# Patient Record
Sex: Male | Born: 1965 | Race: Black or African American | Hispanic: No | Marital: Married | Smoking: Never smoker
Health system: Southern US, Community
[De-identification: ages and names within clinical notes are randomized; demographics above are authoritative.]

## PROBLEM LIST (undated history)

## (undated) ENCOUNTER — Emergency Department (HOSPITAL_COMMUNITY): Admission: EM | Payer: Self-pay | Source: Home / Self Care

## (undated) DIAGNOSIS — F1010 Alcohol abuse, uncomplicated: Secondary | ICD-10-CM

## (undated) DIAGNOSIS — M1990 Unspecified osteoarthritis, unspecified site: Secondary | ICD-10-CM

## (undated) DIAGNOSIS — F329 Major depressive disorder, single episode, unspecified: Secondary | ICD-10-CM

## (undated) DIAGNOSIS — F32A Depression, unspecified: Secondary | ICD-10-CM

## (undated) DIAGNOSIS — F4310 Post-traumatic stress disorder, unspecified: Secondary | ICD-10-CM

## (undated) DIAGNOSIS — I10 Essential (primary) hypertension: Secondary | ICD-10-CM

## (undated) DIAGNOSIS — M109 Gout, unspecified: Secondary | ICD-10-CM

## (undated) HISTORY — PX: KNEE ARTHROSCOPY: SHX127

## (undated) HISTORY — PX: TONSILLECTOMY: SUR1361

## (undated) HISTORY — PX: ANKLE ARTHROSCOPY: SUR85

---
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
## 1992-11-04 HISTORY — PX: BUNIONECTOMY: SHX129

## 1998-06-29 ENCOUNTER — Emergency Department (HOSPITAL_COMMUNITY): Admission: EM | Admit: 1998-06-29 | Discharge: 1998-06-29 | Payer: Self-pay | Admitting: Emergency Medicine

## 1999-12-04 ENCOUNTER — Other Ambulatory Visit: Admission: RE | Admit: 1999-12-04 | Discharge: 1999-12-04 | Payer: Self-pay | Admitting: Orthopedic Surgery

## 2002-08-02 ENCOUNTER — Encounter: Payer: Self-pay | Admitting: Family Medicine

## 2002-08-02 ENCOUNTER — Ambulatory Visit (HOSPITAL_COMMUNITY): Admission: RE | Admit: 2002-08-02 | Discharge: 2002-08-02 | Payer: Self-pay | Admitting: Family Medicine

## 2002-08-18 ENCOUNTER — Encounter: Payer: Self-pay | Admitting: Family Medicine

## 2002-08-18 ENCOUNTER — Ambulatory Visit (HOSPITAL_COMMUNITY): Admission: RE | Admit: 2002-08-18 | Discharge: 2002-08-18 | Payer: Self-pay | Admitting: Family Medicine

## 2004-08-21 ENCOUNTER — Ambulatory Visit (HOSPITAL_BASED_OUTPATIENT_CLINIC_OR_DEPARTMENT_OTHER): Admission: RE | Admit: 2004-08-21 | Discharge: 2004-08-21 | Payer: Self-pay | Admitting: Orthopedic Surgery

## 2004-08-21 ENCOUNTER — Ambulatory Visit (HOSPITAL_COMMUNITY): Admission: RE | Admit: 2004-08-21 | Discharge: 2004-08-21 | Payer: Self-pay | Admitting: Orthopedic Surgery

## 2012-02-05 DIAGNOSIS — Z79899 Other long term (current) drug therapy: Secondary | ICD-10-CM | POA: Diagnosis not present

## 2012-02-05 DIAGNOSIS — M109 Gout, unspecified: Secondary | ICD-10-CM | POA: Diagnosis not present

## 2012-02-05 DIAGNOSIS — M5137 Other intervertebral disc degeneration, lumbosacral region: Secondary | ICD-10-CM | POA: Diagnosis not present

## 2012-02-05 DIAGNOSIS — Z125 Encounter for screening for malignant neoplasm of prostate: Secondary | ICD-10-CM | POA: Diagnosis not present

## 2012-02-05 DIAGNOSIS — I10 Essential (primary) hypertension: Secondary | ICD-10-CM | POA: Diagnosis not present

## 2012-02-26 DIAGNOSIS — M62838 Other muscle spasm: Secondary | ICD-10-CM | POA: Diagnosis not present

## 2012-02-26 DIAGNOSIS — M5137 Other intervertebral disc degeneration, lumbosacral region: Secondary | ICD-10-CM | POA: Diagnosis not present

## 2012-02-26 DIAGNOSIS — M545 Low back pain: Secondary | ICD-10-CM | POA: Diagnosis not present

## 2012-03-02 DIAGNOSIS — M545 Low back pain: Secondary | ICD-10-CM | POA: Diagnosis not present

## 2012-03-02 DIAGNOSIS — M5137 Other intervertebral disc degeneration, lumbosacral region: Secondary | ICD-10-CM | POA: Diagnosis not present

## 2012-03-02 DIAGNOSIS — M62838 Other muscle spasm: Secondary | ICD-10-CM | POA: Diagnosis not present

## 2012-03-05 DIAGNOSIS — M62838 Other muscle spasm: Secondary | ICD-10-CM | POA: Diagnosis not present

## 2012-03-05 DIAGNOSIS — M545 Low back pain: Secondary | ICD-10-CM | POA: Diagnosis not present

## 2012-03-05 DIAGNOSIS — M5137 Other intervertebral disc degeneration, lumbosacral region: Secondary | ICD-10-CM | POA: Diagnosis not present

## 2012-03-17 DIAGNOSIS — M5430 Sciatica, unspecified side: Secondary | ICD-10-CM | POA: Diagnosis not present

## 2012-03-24 DIAGNOSIS — M62838 Other muscle spasm: Secondary | ICD-10-CM | POA: Diagnosis not present

## 2012-03-31 DIAGNOSIS — M5126 Other intervertebral disc displacement, lumbar region: Secondary | ICD-10-CM | POA: Diagnosis not present

## 2012-03-31 DIAGNOSIS — M5137 Other intervertebral disc degeneration, lumbosacral region: Secondary | ICD-10-CM | POA: Diagnosis not present

## 2012-03-31 DIAGNOSIS — IMO0002 Reserved for concepts with insufficient information to code with codable children: Secondary | ICD-10-CM | POA: Diagnosis not present

## 2012-04-16 DIAGNOSIS — M5126 Other intervertebral disc displacement, lumbar region: Secondary | ICD-10-CM | POA: Diagnosis not present

## 2012-04-16 DIAGNOSIS — IMO0002 Reserved for concepts with insufficient information to code with codable children: Secondary | ICD-10-CM | POA: Diagnosis not present

## 2012-04-16 DIAGNOSIS — M5137 Other intervertebral disc degeneration, lumbosacral region: Secondary | ICD-10-CM | POA: Diagnosis not present

## 2012-04-30 DIAGNOSIS — M5126 Other intervertebral disc displacement, lumbar region: Secondary | ICD-10-CM | POA: Diagnosis not present

## 2012-04-30 DIAGNOSIS — IMO0002 Reserved for concepts with insufficient information to code with codable children: Secondary | ICD-10-CM | POA: Diagnosis not present

## 2012-06-01 DIAGNOSIS — M5126 Other intervertebral disc displacement, lumbar region: Secondary | ICD-10-CM | POA: Diagnosis not present

## 2012-06-01 DIAGNOSIS — IMO0002 Reserved for concepts with insufficient information to code with codable children: Secondary | ICD-10-CM | POA: Diagnosis not present

## 2012-06-01 DIAGNOSIS — M5137 Other intervertebral disc degeneration, lumbosacral region: Secondary | ICD-10-CM | POA: Diagnosis not present

## 2012-06-10 DIAGNOSIS — IMO0002 Reserved for concepts with insufficient information to code with codable children: Secondary | ICD-10-CM | POA: Diagnosis not present

## 2012-06-10 DIAGNOSIS — M5126 Other intervertebral disc displacement, lumbar region: Secondary | ICD-10-CM | POA: Diagnosis not present

## 2012-09-16 DIAGNOSIS — IMO0002 Reserved for concepts with insufficient information to code with codable children: Secondary | ICD-10-CM | POA: Diagnosis not present

## 2012-09-16 DIAGNOSIS — M5126 Other intervertebral disc displacement, lumbar region: Secondary | ICD-10-CM | POA: Diagnosis not present

## 2012-09-18 ENCOUNTER — Other Ambulatory Visit: Payer: Self-pay | Admitting: Neurological Surgery

## 2012-09-23 ENCOUNTER — Ambulatory Visit
Admission: RE | Admit: 2012-09-23 | Discharge: 2012-09-23 | Disposition: A | Discharge status: Home / Self Care | Payer: Medicare Other | Source: Ambulatory Visit | Attending: Sports Medicine | Admitting: Sports Medicine

## 2012-09-23 ENCOUNTER — Ambulatory Visit (INDEPENDENT_AMBULATORY_CARE_PROVIDER_SITE_OTHER): Payer: Medicare Other | Admitting: Sports Medicine

## 2012-09-23 VITALS — BP 142/90 | Ht 71.0 in | Wt 176.0 lb

## 2012-09-23 DIAGNOSIS — M25561 Pain in right knee: Secondary | ICD-10-CM

## 2012-09-23 DIAGNOSIS — M25562 Pain in left knee: Secondary | ICD-10-CM

## 2012-09-23 DIAGNOSIS — M1710 Unilateral primary osteoarthritis, unspecified knee: Secondary | ICD-10-CM | POA: Diagnosis not present

## 2012-09-23 DIAGNOSIS — M25569 Pain in unspecified knee: Secondary | ICD-10-CM

## 2012-09-23 MED ORDER — MELOXICAM 15 MG PO TABS: 15.0000 mg | ORAL_TABLET | Freq: Every day | ORAL | Status: DC

## 2012-09-23 NOTE — Progress Notes (Signed)
  Subjective:    Patient ID: Jared [name], male    DOB: 1966/08/09, 46 y.o.   MRN: [id number]  HPI chief complaint: Bilateral knee pain and low back pain  Patient is a 46 year old male whom I appears for for many years at [facility]. He has been treated over the years for bilateral ankle and knee pain. Multiple surgeries to each of these joints. 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 He's been treated with anti-inflammatories and cortisone injections. He is a disabled Building services engineer and is requesting that I write a letter on his behalf. He suffered a fall earlier this year when his knee gave way. This fall resulted in a lumbar disc herniation which will now need operative intervention. He is scheduled for surgery next Tuesday. He has had treatment both here and [facility] and as well as through the [facility]. He takes Mobic daily. Oxycodone when necessary. Today his knee pain is tolerable. No swelling.  No known drug allergies Socially he states he drinks a sixpack of beer a week He is retired from [employer]    Review of Systems     Objective:   Physical Exam Well-developed, well-nourished. No acute distress. Awake alert and oriented x3  Right knee: Active and passive range of motion on today's exam shows a range from 0-100. No effusion. There is tenderness to palpation along the medial joint line but a negative McMurray. Slight tenderness along the lateral joint line. Knee is stable to ligamentous exam. No erythema. Neurovascular intact distally.  Left knee: Active and passive range of motion on today's exam shows a range from 0-100. No effusion. There is tenderness along the medial joint line to palpation but no tenderness laterally. Negative McMurray. Good ligamentous stability. Neurovascularly intact distally.  Patient walks with a slight limp  X-rays of each of his knees done today shows a mild amount of  medial compartmental narrowing and some sclerosis of the medial compartment.   Lumbar spine was not evaluated. Patient is scheduled for surgery with Dr. Danielle Dess next week.       Assessment & Plan:  1. 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 Lumbar disc herniation  Review of the records from [facility] shows this patient to have had multiple procedures to each ankle and each knee. Patient did have some early degenerative changes on his right knee arthroscopy in January 2001, specifically some grade 2 and grade 3 changes in the medial compartment. Although his x-rays show only minimal degenerative changes, I do believe that the instability that he experienced which led to his fall earlier this year was a result of his chronic knee issues. I will refill his meloxicam but will defer his narcotic refill to his primary care physician. I will write a letter on his behalf to the [place]. Followup prn.

## 2012-09-24 ENCOUNTER — Encounter (HOSPITAL_COMMUNITY)
Admission: RE | Admit: 2012-09-24 | Discharge: 2012-09-24 | Disposition: A | Discharge status: Home / Self Care | Payer: Medicare Other | Source: Ambulatory Visit | Attending: Neurological Surgery | Admitting: Neurological Surgery

## 2012-09-24 ENCOUNTER — Encounter (HOSPITAL_COMMUNITY): Payer: Self-pay

## 2012-09-24 ENCOUNTER — Encounter (HOSPITAL_COMMUNITY): Payer: Self-pay | Admitting: Respiratory Therapy

## 2012-09-24 ENCOUNTER — Ambulatory Visit (HOSPITAL_COMMUNITY)
Admission: RE | Admit: 2012-09-24 | Discharge: 2012-09-24 | Disposition: A | Discharge status: Home / Self Care | Payer: Medicare Other | Source: Ambulatory Visit | Attending: Anesthesiology | Admitting: Anesthesiology

## 2012-09-24 DIAGNOSIS — Z01818 Encounter for other preprocedural examination: Secondary | ICD-10-CM | POA: Insufficient documentation

## 2012-09-24 HISTORY — DX: Post-traumatic stress disorder, unspecified: F43.10

## 2012-09-24 HISTORY — DX: Essential (primary) hypertension: I10

## 2012-09-24 HISTORY — DX: Gout, unspecified: M10.9

## 2012-09-24 HISTORY — DX: Depression, unspecified: F32.A

## 2012-09-24 HISTORY — DX: Unspecified osteoarthritis, unspecified site: M19.90

## 2012-09-24 HISTORY — DX: Major depressive disorder, single episode, unspecified: F32.9

## 2012-09-24 LAB — CBC
MCH: 31.5 pg (ref 26.0–34.0)
Platelets: 246 10*3/uL (ref 150–400)
RBC: 5.05 MIL/uL (ref 4.22–5.81)
WBC: 5.1 10*3/uL (ref 4.0–10.5)

## 2012-09-24 LAB — BASIC METABOLIC PANEL
Calcium: 9.7 mg/dL (ref 8.4–10.5)
GFR calc non Af Amer: >90 mL/min (ref >90)
Sodium: 136 mEq/L (ref 135–145)

## 2012-09-24 LAB — SURGICAL PCR SCREEN: Staphylococcus aureus: POSITIVE — AB

## 2012-09-24 NOTE — Pre-Procedure Instructions (Signed)
20 Jared [name]  09/24/2012   Your procedure is scheduled on:  Tuesday, November 26  Report to [facility] at 0630 AM.  Call this number if you have problems the morning of surgery: [phone]   Remember:   Do not eat food or drink liquids:After Midnight.      Take these medicines the morning of surgery with A SIP OF WATER: Gabapentin,Allopurinol   Do not wear jewelry, make-up or nail polish.  Do not wear lotions, powders, or perfumes. You may wear deodorant.  Do not shave 48 hours prior to surgery. Men may shave face and neck.  Do not bring valuables to the hospital.  Contacts, dentures or bridgework may not be worn into surgery.  Leave suitcase in the car. After surgery it may be brought to your room.  For patients admitted to the hospital, checkout time is 11:00 AM the day of discharge.   Patients discharged the day of surgery will not be allowed to drive home.     Special Instructions: Shower using CHG 2 nights before surgery and the night before surgery.  If you shower the day of surgery use CHG.  Use special wash - you have one bottle of CHG for all showers.  You should use approximately 1/3 of the bottle for each shower.   Please read over the following fact sheets that you were given: Pain Booklet, Coughing and Deep Breathing, MRSA Information and Surgical Site Infection Prevention

## 2012-09-28 MED ORDER — CEFAZOLIN SODIUM-DEXTROSE 2-3 GM-% IV SOLR
2.0000 g | INTRAVENOUS | Status: AC
  Administered 2012-09-29: 2 g via INTRAVENOUS
  Filled 2012-09-28: qty 50

## 2012-09-29 ENCOUNTER — Encounter (HOSPITAL_COMMUNITY): Payer: Self-pay | Admitting: Certified Registered Nurse Anesthetist

## 2012-09-29 ENCOUNTER — Ambulatory Visit (HOSPITAL_COMMUNITY): Payer: Medicare Other | Admitting: Certified Registered Nurse Anesthetist

## 2012-09-29 ENCOUNTER — Ambulatory Visit (HOSPITAL_COMMUNITY)
Admission: RE | Admit: 2012-09-29 | Discharge: 2012-09-29 | Disposition: A | Discharge status: Home / Self Care | Payer: Medicare Other | Source: Ambulatory Visit | Attending: Neurological Surgery | Admitting: Neurological Surgery

## 2012-09-29 ENCOUNTER — Encounter (HOSPITAL_COMMUNITY)
Admission: RE | Disposition: A | Discharge status: Home / Self Care | Payer: Self-pay | Source: Ambulatory Visit | Attending: Neurological Surgery

## 2012-09-29 ENCOUNTER — Ambulatory Visit (HOSPITAL_COMMUNITY): Payer: Medicare Other

## 2012-09-29 DIAGNOSIS — M5126 Other intervertebral disc displacement, lumbar region: Secondary | ICD-10-CM | POA: Diagnosis not present

## 2012-09-29 DIAGNOSIS — M539 Dorsopathy, unspecified: Secondary | ICD-10-CM | POA: Diagnosis not present

## 2012-09-29 DIAGNOSIS — I10 Essential (primary) hypertension: Secondary | ICD-10-CM | POA: Insufficient documentation

## 2012-09-29 DIAGNOSIS — IMO0002 Reserved for concepts with insufficient information to code with codable children: Secondary | ICD-10-CM | POA: Diagnosis not present

## 2012-09-29 HISTORY — PX: LUMBAR LAMINECTOMY/DECOMPRESSION MICRODISCECTOMY: SHX5026

## 2012-09-29 SURGERY — LUMBAR LAMINECTOMY/DECOMPRESSION MICRODISCECTOMY 2 LEVELS
Anesthesia: General | Site: Back | Laterality: Left | Wound class: Clean

## 2012-09-29 MED ORDER — LIDOCAINE HCL (CARDIAC) 20 MG/ML IV SOLN
INTRAVENOUS | Status: DC | PRN
  Administered 2012-09-29: 100 mg via INTRAVENOUS

## 2012-09-29 MED ORDER — CEFAZOLIN SODIUM 1-5 GM-% IV SOLN
1.0000 g | Freq: Three times a day (TID) | INTRAVENOUS | Status: DC
  Administered 2012-09-29: 1 g via INTRAVENOUS
  Filled 2012-09-29 (×2): qty 50

## 2012-09-29 MED ORDER — ALLOPURINOL 300 MG PO TABS
300.0000 mg | ORAL_TABLET | Freq: Every day | ORAL | Status: DC
  Administered 2012-09-29: 300 mg via ORAL
  Filled 2012-09-29: qty 1

## 2012-09-29 MED ORDER — PHENYLEPHRINE HCL 10 MG/ML IJ SOLN
INTRAMUSCULAR | Status: DC | PRN
  Administered 2012-09-29 (×2): 80 ug via INTRAVENOUS

## 2012-09-29 MED ORDER — OXYCODONE-ACETAMINOPHEN 5-325 MG PO TABS
1.0000 | ORAL_TABLET | ORAL | Status: DC | PRN
  Administered 2012-09-29: 2 via ORAL
  Filled 2012-09-29: qty 2

## 2012-09-29 MED ORDER — LACTATED RINGERS IV SOLN
INTRAVENOUS | Status: DC | PRN
  Administered 2012-09-29 (×2): via INTRAVENOUS

## 2012-09-29 MED ORDER — FENTANYL CITRATE 0.05 MG/ML IJ SOLN
INTRAMUSCULAR | Status: DC | PRN
  Administered 2012-09-29: 100 ug via INTRAVENOUS

## 2012-09-29 MED ORDER — SODIUM CHLORIDE 0.9 % IJ SOLN: 3.0000 mL | INTRAMUSCULAR | Status: DC | PRN

## 2012-09-29 MED ORDER — OXYCODONE HCL 5 MG/5ML PO SOLN: 5.0000 mg | Freq: Once | ORAL | Status: DC | PRN

## 2012-09-29 MED ORDER — LISINOPRIL 20 MG PO TABS
20.0000 mg | ORAL_TABLET | Freq: Every day | ORAL | Status: DC
  Administered 2012-09-29: 20 mg via ORAL
  Filled 2012-09-29: qty 1

## 2012-09-29 MED ORDER — MIRTAZAPINE 15 MG PO TABS
15.0000 mg | ORAL_TABLET | Freq: Every day | ORAL | Status: DC
  Filled 2012-09-29: qty 1

## 2012-09-29 MED ORDER — ROCURONIUM BROMIDE 100 MG/10ML IV SOLN
INTRAVENOUS | Status: DC | PRN
  Administered 2012-09-29: 10 mg via INTRAVENOUS
  Administered 2012-09-29: 50 mg via INTRAVENOUS

## 2012-09-29 MED ORDER — LABETALOL HCL 5 MG/ML IV SOLN
INTRAVENOUS | Status: AC
  Filled 2012-09-29: qty 4

## 2012-09-29 MED ORDER — OXYCODONE HCL 5 MG PO TABS: 5.0000 mg | ORAL_TABLET | Freq: Once | ORAL | Status: DC | PRN

## 2012-09-29 MED ORDER — ACETAMINOPHEN 650 MG RE SUPP: 650.0000 mg | RECTAL | Status: DC | PRN

## 2012-09-29 MED ORDER — HYDROMORPHONE HCL PF 1 MG/ML IJ SOLN
INTRAMUSCULAR | Status: AC
  Filled 2012-09-29: qty 1

## 2012-09-29 MED ORDER — KETOROLAC TROMETHAMINE 30 MG/ML IJ SOLN
30.0000 mg | Freq: Four times a day (QID) | INTRAMUSCULAR | Status: DC
  Administered 2012-09-29: 30 mg via INTRAVENOUS

## 2012-09-29 MED ORDER — MENTHOL 3 MG MT LOZG: 1.0000 | LOZENGE | OROMUCOSAL | Status: DC | PRN

## 2012-09-29 MED ORDER — PHENOL 1.4 % MT LIQD: 1.0000 | OROMUCOSAL | Status: DC | PRN

## 2012-09-29 MED ORDER — OXYCODONE-ACETAMINOPHEN 5-325 MG PO TABS: 1.0000 | ORAL_TABLET | ORAL | Status: DC | PRN

## 2012-09-29 MED ORDER — ALUM & MAG HYDROXIDE-SIMETH 200-200-20 MG/5ML PO SUSP: 30.0000 mL | Freq: Four times a day (QID) | ORAL | Status: DC | PRN

## 2012-09-29 MED ORDER — SODIUM CHLORIDE 0.9 % IJ SOLN
3.0000 mL | Freq: Two times a day (BID) | INTRAMUSCULAR | Status: DC
  Administered 2012-09-29: 3 mL via INTRAVENOUS

## 2012-09-29 MED ORDER — STERILE WATER FOR IRRIGATION IR SOLN
Status: DC | PRN
  Administered 2012-09-29: 1000 mL

## 2012-09-29 MED ORDER — LIDOCAINE-EPINEPHRINE 1 %-1:100000 IJ SOLN
INTRAMUSCULAR | Status: DC | PRN
  Administered 2012-09-29: 4.5 mL via INTRADERMAL

## 2012-09-29 MED ORDER — HYDROMORPHONE HCL PF 1 MG/ML IJ SOLN
0.2500 mg | INTRAMUSCULAR | Status: DC | PRN
  Administered 2012-09-29 (×4): 0.5 mg via INTRAVENOUS

## 2012-09-29 MED ORDER — DIAZEPAM 5 MG PO TABS
5.0000 mg | ORAL_TABLET | Freq: Four times a day (QID) | ORAL | Status: DC | PRN
  Administered 2012-09-29: 5 mg via ORAL
  Filled 2012-09-29: qty 1

## 2012-09-29 MED ORDER — HEMOSTATIC AGENTS (NO CHARGE) OPTIME
TOPICAL | Status: DC | PRN
  Administered 2012-09-29: 1 via TOPICAL

## 2012-09-29 MED ORDER — SODIUM CHLORIDE 0.9 % IV SOLN: 250.0000 mL | INTRAVENOUS | Status: DC

## 2012-09-29 MED ORDER — VITAMIN D3 25 MCG (1000 UNIT) PO TABS
1000.0000 [IU] | ORAL_TABLET | Freq: Every day | ORAL | Status: DC
  Administered 2012-09-29: 1000 [IU] via ORAL
  Filled 2012-09-29: qty 1

## 2012-09-29 MED ORDER — MORPHINE SULFATE 2 MG/ML IJ SOLN
1.0000 mg | INTRAMUSCULAR | Status: DC | PRN
  Administered 2012-09-29: 2 mg via INTRAVENOUS
  Filled 2012-09-29: qty 1

## 2012-09-29 MED ORDER — METOCLOPRAMIDE HCL 5 MG/ML IJ SOLN: 10.0000 mg | Freq: Once | INTRAMUSCULAR | Status: DC | PRN

## 2012-09-29 MED ORDER — AMOXICILLIN 500 MG PO CAPS
500.0000 mg | ORAL_CAPSULE | Freq: Three times a day (TID) | ORAL | Status: DC
  Administered 2012-09-29: 500 mg via ORAL
  Filled 2012-09-29 (×2): qty 1

## 2012-09-29 MED ORDER — ONDANSETRON HCL 4 MG/2ML IJ SOLN: 4.0000 mg | INTRAMUSCULAR | Status: DC | PRN

## 2012-09-29 MED ORDER — 0.9 % SODIUM CHLORIDE (POUR BTL) OPTIME
TOPICAL | Status: DC | PRN
  Administered 2012-09-29: 1000 mL

## 2012-09-29 MED ORDER — DIAZEPAM 5 MG PO TABS: 5.0000 mg | ORAL_TABLET | Freq: Four times a day (QID) | ORAL | Status: DC | PRN

## 2012-09-29 MED ORDER — NEOSTIGMINE METHYLSULFATE 1 MG/ML IJ SOLN
INTRAMUSCULAR | Status: DC | PRN
  Administered 2012-09-29: 5 mg via INTRAVENOUS

## 2012-09-29 MED ORDER — THROMBIN 5000 UNITS EX KIT
PACK | CUTANEOUS | Status: DC | PRN
  Administered 2012-09-29 (×2): 5000 [IU] via TOPICAL

## 2012-09-29 MED ORDER — ACETAMINOPHEN 10 MG/ML IV SOLN
INTRAVENOUS | Status: AC
  Administered 2012-09-29: 1000 mg via INTRAVENOUS
  Filled 2012-09-29: qty 100

## 2012-09-29 MED ORDER — ONDANSETRON HCL 4 MG/2ML IJ SOLN
INTRAMUSCULAR | Status: DC | PRN
  Administered 2012-09-29: 4 mg via INTRAVENOUS

## 2012-09-29 MED ORDER — ACETAMINOPHEN 325 MG PO TABS: 650.0000 mg | ORAL_TABLET | ORAL | Status: DC | PRN

## 2012-09-29 MED ORDER — DEXAMETHASONE SODIUM PHOSPHATE 10 MG/ML IJ SOLN
INTRAMUSCULAR | Status: AC
  Administered 2012-09-29: 10 mg via INTRAVENOUS
  Filled 2012-09-29: qty 1

## 2012-09-29 MED ORDER — PROPOFOL 10 MG/ML IV BOLUS
INTRAVENOUS | Status: DC | PRN
  Administered 2012-09-29: 200 mg via INTRAVENOUS

## 2012-09-29 MED ORDER — MIDAZOLAM HCL 5 MG/5ML IJ SOLN
INTRAMUSCULAR | Status: DC | PRN
  Administered 2012-09-29: 2 mg via INTRAVENOUS

## 2012-09-29 MED ORDER — SODIUM CHLORIDE 0.9 % IV SOLN
INTRAVENOUS | Status: AC
  Filled 2012-09-29: qty 500

## 2012-09-29 MED ORDER — GABAPENTIN 300 MG PO CAPS
300.0000 mg | ORAL_CAPSULE | Freq: Three times a day (TID) | ORAL | Status: DC
  Administered 2012-09-29: 300 mg via ORAL
  Filled 2012-09-29 (×2): qty 1

## 2012-09-29 MED ORDER — THROMBIN 5000 UNITS EX SOLR: CUTANEOUS | Status: DC | PRN

## 2012-09-29 MED ORDER — SODIUM CHLORIDE 0.9 % IR SOLN
Status: DC | PRN
  Administered 2012-09-29: 10:00:00

## 2012-09-29 MED ORDER — LABETALOL HCL 5 MG/ML IV SOLN
5.0000 mg | INTRAVENOUS | Status: AC | PRN
  Administered 2012-09-29 (×4): 5 mg via INTRAVENOUS

## 2012-09-29 MED ORDER — GLYCOPYRROLATE 0.2 MG/ML IJ SOLN
INTRAMUSCULAR | Status: DC | PRN
  Administered 2012-09-29: .8 mg via INTRAVENOUS

## 2012-09-29 MED ORDER — BACITRACIN 50000 UNITS IM SOLR
INTRAMUSCULAR | Status: AC
  Filled 2012-09-29: qty 1

## 2012-09-29 MED ORDER — BUPIVACAINE HCL (PF) 0.5 % IJ SOLN
INTRAMUSCULAR | Status: DC | PRN
  Administered 2012-09-29: 4.5 mL

## 2012-09-29 SURGICAL SUPPLY — 59 items
ADH SKN CLS APL DERMABOND .7 (GAUZE/BANDAGES/DRESSINGS)
ADH SKN CLS LQ APL DERMABOND (GAUZE/BANDAGES/DRESSINGS) ×1
BAG DECANTER FOR FLEXI CONT (MISCELLANEOUS) ×2 IMPLANT
BLADE SURG ROTATE 9660 (MISCELLANEOUS) IMPLANT
BUR ACORN 6.0 (BURR) ×1 IMPLANT
BUR MATCHSTICK NEURO 3.0 LAGG (BURR) ×2 IMPLANT
CANISTER SUCTION 2500CC (MISCELLANEOUS) ×2 IMPLANT
CLOTH BEACON ORANGE TIMEOUT ST (SAFETY) ×2 IMPLANT
CONT SPEC 4OZ CLIKSEAL STRL BL (MISCELLANEOUS) ×2 IMPLANT
DECANTER SPIKE VIAL GLASS SM (MISCELLANEOUS) ×2 IMPLANT
DERMABOND ADHESIVE PROPEN (GAUZE/BANDAGES/DRESSINGS) ×1
DERMABOND ADVANCED (GAUZE/BANDAGES/DRESSINGS)
DERMABOND ADVANCED .7 DNX12 (GAUZE/BANDAGES/DRESSINGS) ×1 IMPLANT
DERMABOND ADVANCED .7 DNX6 (GAUZE/BANDAGES/DRESSINGS) IMPLANT
DRAPE LAPAROTOMY 100X72X124 (DRAPES) ×2 IMPLANT
DRAPE MICROSCOPE LEICA (MISCELLANEOUS) ×1 IMPLANT
DRAPE POUCH INSTRU U-SHP 10X18 (DRAPES) ×2 IMPLANT
DRAPE PROXIMA HALF (DRAPES) IMPLANT
DURAPREP 26ML APPLICATOR (WOUND CARE) ×2 IMPLANT
ELECT REM PT RETURN 9FT ADLT (ELECTROSURGICAL) ×2
ELECTRODE REM PT RTRN 9FT ADLT (ELECTROSURGICAL) ×1 IMPLANT
GAUZE SPONGE 4X4 16PLY XRAY LF (GAUZE/BANDAGES/DRESSINGS) IMPLANT
GLOVE BIO SURGEON STRL SZ 6.5 (GLOVE) ×1 IMPLANT
GLOVE BIO SURGEON STRL SZ8 (GLOVE) ×1 IMPLANT
GLOVE BIOGEL PI IND STRL 6.5 (GLOVE) IMPLANT
GLOVE BIOGEL PI IND STRL 8 (GLOVE) IMPLANT
GLOVE BIOGEL PI IND STRL 8.5 (GLOVE) ×1 IMPLANT
GLOVE BIOGEL PI INDICATOR 6.5 (GLOVE) ×1
GLOVE BIOGEL PI INDICATOR 8 (GLOVE) ×1
GLOVE BIOGEL PI INDICATOR 8.5 (GLOVE) ×2
GLOVE ECLIPSE 7.5 STRL STRAW (GLOVE) ×1 IMPLANT
GLOVE ECLIPSE 8.5 STRL (GLOVE) ×2 IMPLANT
GLOVE EXAM NITRILE LRG STRL (GLOVE) IMPLANT
GLOVE EXAM NITRILE MD LF STRL (GLOVE) IMPLANT
GLOVE EXAM NITRILE XL STR (GLOVE) IMPLANT
GLOVE EXAM NITRILE XS STR PU (GLOVE) IMPLANT
GOWN BRE IMP SLV AUR LG STRL (GOWN DISPOSABLE) ×2 IMPLANT
GOWN BRE IMP SLV AUR XL STRL (GOWN DISPOSABLE) ×2 IMPLANT
GOWN STRL REIN 2XL LVL4 (GOWN DISPOSABLE) ×2 IMPLANT
KIT BASIN OR (CUSTOM PROCEDURE TRAY) ×2 IMPLANT
KIT ROOM TURNOVER OR (KITS) ×2 IMPLANT
NDL SPNL 20GX3.5 QUINCKE YW (NEEDLE) IMPLANT
NEEDLE HYPO 22GX1.5 SAFETY (NEEDLE) ×2 IMPLANT
NEEDLE SPNL 20GX3.5 QUINCKE YW (NEEDLE) ×2 IMPLANT
NS IRRIG 1000ML POUR BTL (IV SOLUTION) ×2 IMPLANT
PACK LAMINECTOMY NEURO (CUSTOM PROCEDURE TRAY) ×2 IMPLANT
PAD ARMBOARD 7.5X6 YLW CONV (MISCELLANEOUS) ×8 IMPLANT
PATTIES SURGICAL .5 X1 (DISPOSABLE) ×2 IMPLANT
RUBBERBAND STERILE (MISCELLANEOUS) ×2 IMPLANT
SPONGE GAUZE 4X4 12PLY (GAUZE/BANDAGES/DRESSINGS) ×1 IMPLANT
SPONGE SURGIFOAM ABS GEL SZ50 (HEMOSTASIS) ×2 IMPLANT
SUT VIC AB 1 CT1 18XBRD ANBCTR (SUTURE) ×1 IMPLANT
SUT VIC AB 1 CT1 8-18 (SUTURE) ×2
SUT VIC AB 2-0 CP2 18 (SUTURE) ×2 IMPLANT
SUT VIC AB 3-0 SH 8-18 (SUTURE) ×2 IMPLANT
SYR 20ML ECCENTRIC (SYRINGE) ×2 IMPLANT
TOWEL OR 17X24 6PK STRL BLUE (TOWEL DISPOSABLE) ×2 IMPLANT
TOWEL OR 17X26 10 PK STRL BLUE (TOWEL DISPOSABLE) ×2 IMPLANT
WATER STERILE IRR 1000ML POUR (IV SOLUTION) ×2 IMPLANT

## 2012-09-29 NOTE — Preoperative (Signed)
Beta Blockers   Reason not to administer Beta Blockers:Not Applicable 

## 2012-09-29 NOTE — Discharge Summary (Signed)
Physician Discharge Summary  Patient ID: Jared [name] MRN: [id number] DOB/AGE: 1966/02/05 46 y.o.  Admit date: 09/29/2012 Discharge date: 09/29/2012  Admission Diagnoses:HNP L45 and L5S1 Left  Discharge Diagnoses: HNP L45 and L5S1 Left  Active Problems:  * No active hospital problems. *    Discharged Condition: good  Hospital Course: Uncomplicated microdiscectomy L45 and L5S1 Left   Consults: None  Significant Diagnostic Studies: None  Treatments: surgery: Uncomplicated microdiscectomy L45 and L5S1 Left  Discharge Exam: Blood pressure 152/86, pulse 73, temperature 98.6 F (37 C), temperature source Oral, resp. rate 20, SpO2 97.00%. Neurologic: Alert and oriented X 3, normal strength and tone. Normal symmetric reflexes. Normal coordination and gait Wound:CDI  Disposition: Home     Medication List     As of 09/29/2012  6:31 PM    TAKE these medications         allopurinol 300 MG tablet   Commonly known as: ZYLOPRIM   Take 300 mg by mouth daily.      amoxicillin 500 MG capsule   Commonly known as: AMOXIL   Take 500 mg by mouth 3 (three) times daily. Take for 7 days.      cholecalciferol 1000 UNITS tablet   Commonly known as: VITAMIN D   Take 1,000 Units by mouth daily.      diazepam 5 MG tablet   Commonly known as: VALIUM   Take 1 tablet (5 mg total) by mouth every 6 (six) hours as needed.      gabapentin 300 MG capsule   Commonly known as: NEURONTIN   Take 300 mg by mouth 3 (three) times daily.      lisinopril 20 MG tablet   Commonly known as: PRINIVIL,ZESTRIL   Take 20 mg by mouth daily.      meloxicam 15 MG tablet   Commonly known as: MOBIC   Take 1 tablet (15 mg total) by mouth daily.      mirtazapine 15 MG tablet   Commonly known as: REMERON   Take 15 mg by mouth at bedtime.      oxyCODONE-acetaminophen 5-325 MG per tablet   Commonly known as: PERCOCET/ROXICET   Take 1-2 tablets by mouth every 4 (four) hours as needed.          Signed: Dorian Heckle, MD 09/29/2012, 6:31 PM

## 2012-09-29 NOTE — Op Note (Signed)
Preoperative diagnosis: Herniated nucleus pulposus L4-5, L5-S1 left with left lumbar radiculopathy Postoperative diagnosis: Herniated nucleus pulposus L4-5, L5-S1 left with left lumbar radiculopathy Procedure: Microdiscectomy L4-5, L5-S1 left with operating microscope microdissection technique Surgeon: Barnett Abu M.D. Assistant: Maeola Harman M.D. Indications: Jared [name] senior is a 46 year old individual who's had significant problems with back and left lower semi-pain he is failed all manner of conservative management was found to have some entrapment of the exiting L5 nerve root at L4-5 and the health S1 nerve root at L5-S1 on the left side he has advanced degenerative changes with a broad-based protrusion of disc at L4-5 and also at L5-S1 causing some lateral recess stenosis. He's been advised regarding the need for surgical decompression at this point.  Procedure: The patient was brought to the operating room supine on a stretcher. After the smooth induction of general endotracheal anesthesia patient was carefully turned to the prone position with the bony prominences being appropriately padded and protected. The lower lumbar spine was prepped with alcohol and DuraPrep and then draped in a sterile fashion. The needle was used to localize the area of L4-5 and a radiograph was obtained demonstrating a needle at the level of L4-5. Skin was infiltrated with 10 cc of lidocaine 1-100,000 epinephrine mixed 50-50 with half percent Marcaine. An incision was made and carried down to the lumbar dorsal fascia the fascia was opened on the left side of the midline and a subperiosteal dissection was performed in the interlaminar space of L4-5. A self-retaining retractor was then placed into the wound. The yellow ligament was taken up from the interlaminar space at L4-5 and the inferior margin of the lamina of L4 was removed and a partial facetectomy was performed at L4-5. The common dural tube was explored  and the take off of the L5 nerve root was identified and gradually mobilized. A significant mass of the disc with a hardened somewhat calcified ligamentous attachment was encountered. This caused elevation of the L5 nerve root at the level of the L4-5 facet joint in the lateral recess. This mass was removed and some bits of disc material were encountered also. The disc space itself was noted be quite collapsed. It could not easily be entered even with a thin nerve hook. The area was therefore decompressed and the L5 nerve root was well mobilized and the decompression was checked in to the foramen. Hemostasis was then achieved in these tissues.  Next we explored the L5-S1 interspace then created a laminotomy removing the inferior margin of the lamina of L5. The facet joint at L5-S1 was then. He'll ligament was elevated. The S1 nerve root was identified. It is gradually mobilized and noted to be tethered to the disc space. I releasing some soft tissues it could gradually be mobilized off of a significant mass that protruded from the disc space similar to what was encountered at L4-L5. This mass was similarly opened and removed using and osteophytes will some more central and inferior disc material was encountered behind the body of S1 this was densely tethered to the vertebral body. As much of this mass that could be mobilized was removed. In the end the common dural tube and the S1 nerve root were both well decompressed. Hemostasis was achieved. The wound was irrigated copiously with saline.  After adequate decompression was obtained hemostasis and the soft tissues was verified retractor was removed the operating microscope was removed from the field and the lumbar dorsal fascia was closed with #1 and 2-0  Vicryl in interrupted fashion. 3-0 Vicryl was used to close subcuticular skin. Blood loss was estimated at 50 cc.

## 2012-09-29 NOTE — Transfer of Care (Signed)
Immediate Anesthesia Transfer of Care Note  Patient: Jared [name]  Procedure(s) Performed: Procedure(s) (LRB) with comments: LUMBAR LAMINECTOMY/DECOMPRESSION MICRODISCECTOMY 2 LEVELS (Left) - Left Lumbar four-five, Lumbar five-Sacral one Microdiskectomy  Patient Location: PACU  Anesthesia Type:General  Level of Consciousness: awake, alert , oriented and patient cooperative  Airway & Oxygen Therapy: Patient Spontanous Breathing and Patient connected to nasal cannula oxygen  Post-op Assessment: Report given to PACU RN, Post -op Vital signs reviewed and stable and Patient moving all extremities  Post vital signs: Reviewed and stable  Complications: No apparent anesthesia complications

## 2012-09-29 NOTE — Plan of Care (Signed)
Problem: Consults Goal: Diagnosis - Spinal Surgery Outcome: Completed/Met Date Met:  09/29/12 Microdiscectomy

## 2012-09-29 NOTE — H&P (Signed)
Di Kindle. [place], Sr.  #[id number] DOB:  1965-12-16    CHIEF COMPLAINT:    Pain in the left lower extremity.    HISTORY OF PRESENT ILLNESS:  Logan Baltimore, Sr. is a 46 year old right-handed individual who tells me that rather acutely on the 13th of February he developed some pain radiating into his back, his buttocks and into his left lower extremity.  He notes that he gets numbness down into the left foot and toes and particularly has pain onto the bottom of the foot radiating from a region of the posterior superior iliac spine on the left through the gluteus, posterior thigh to the region of the calf and onto the heel and bottom on the foot.  He has been seen and treated by Dr. Maurice Small and has had three steroid injections.  He notes that the injections would give very transient minimal relief for about a week's time at most.  The pain has been persisting and continuing to bother him more severely.    In May of this year, he underwent an MRI of the lumbar spine and this study is reviewed in the office.  It demonstrates that there is a formidable disc protrusion on the left side at L4-5 compressing the exit zone for the L5 nerve root.  There is a second smaller disc herniation that is lateral against the S1 nerve root and entrance to the foramen.  Both the L4-5 and L5-S1 disc spaces are quite degenerated.  This are some modest degenerative changes in the disc at L2-3 but no evidence of any compressive phenomenon.   Thayer Ohm tells me that he has not had back problems ever until this past February.  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  During that time, he has had issues with other joints but never his back.  At this time, he notes he does not truly have back pain but the buttock and leg pain on the left side.    PAST MEDICAL HISTORY:  His general health has been very good.  He does have some hypertension.  He is seen and treated for  general medical issues by Dr. Frazier Butt.    PAST SURGICAL HISTORY:  Left and right knee surgery in January of 2001 and ankle surgery in February of 2003.    SOCIAL HISTORY:    He does not smoke.  He drinks alcohol on a social basis.  Height and weight have been stable at 5'11" and 176 pounds.  MEDICATIONS:    Currently the patient is taking Lisinopril 20 mg. q.d., Mirtazapine 15 mg. q.d., Allopurinol 300 mg. q.d., Gabapentin 300 mg. q.d., and Cholecalciferol 1000 units q.d.     REVIEW OF SYSTEMS:   Notable for wearing of glasses, high blood pressure, leg pain while walking, back pain, and leg pain on a 14-point Review sheet noted in the office today.     PHYSICAL EXAMINATION:  On physical examination, he stands straight and erect with some difficulty.  He is able to balance onto his toes and onto his heels although he notes that is left leg feels weak when balancing on his toes.  His motor function is good in the iliopsoas and quadriceps otherwise.  Reflexes are symmetrically depressed in the patellae and Achilles both.    IMPRESSION:    The patient has evidence of disc degenerative changes at L4-5 and L5-S1 with left-sided disc protrusion which is quite significant at  L4-5.  I do note that he has some chronic changes to the disc at this level as he does at L5-S1.  In light of the fact that his back is functioning quite well, I suggested that the best way to get relief from his radicular pain would be to do microdiskectomy at L4-5 and L5-S1. This would require a small incision measuring approximately an inch and a half on his back, doing a laminotomy to remove some overgrown bone on the backside of the spine accessing the area where the nerve is, moving the nerve off the disc mass and resecting the disc protrusion itself.  The surgery typically is done as an outpatient.  The major risks of the surgery include bleeding and infection, and the singular most bothersome one is that of spinal fluid leak was  also discussed.  All of these things not withstanding, I believe that he should have good relief of his radicular symptoms in time once the nerve root is decompressed at L4-5 and L5-S1.  I indicated that he does have some advanced degenerative changes in the dis at L4-5 and L5-S1; however, we would like to avoid any significant manipulation of the disc space so as not to engender any inflammatory processes that might aggravate significant back pain.  If that were the case, then ultimately he would need a fusion of his lumbar spine but certainly for the time being a simple decompression should get him the relief that he needs.  He is admitted for surgery.

## 2012-09-29 NOTE — Progress Notes (Signed)
Pt transported via stretcher on room air to room assisted up to bathrm voided without difficulty  Pain remains a 4/10

## 2012-09-29 NOTE — Anesthesia Postprocedure Evaluation (Signed)
Anesthesia Post Note  Patient: Jared [name]  Procedure(s) Performed: Procedure(s) (LRB): LUMBAR LAMINECTOMY/DECOMPRESSION MICRODISCECTOMY 2 LEVELS (Left)  Anesthesia type: general  Patient location: PACU  Post pain: Pain level controlled  Post assessment: Patient's Cardiovascular Status Stable  Last Vitals:  Filed Vitals:   09/29/12 1315  BP: 148/101  Pulse: 70  Temp:   Resp: 21    Post vital signs: Reviewed and stable  Level of consciousness: sedated  Complications: No apparent anesthesia complications

## 2012-09-29 NOTE — Progress Notes (Signed)
B/P had come down with pain medication for 1215 pressure but  , b/p elevated at 1230 treated with trandate 5 mg IVP

## 2012-09-29 NOTE — Anesthesia Preprocedure Evaluation (Signed)
Anesthesia Evaluation  Patient identified by MRN, date of birth, ID band Patient awake    Reviewed: Allergy & Precautions, H&P , NPO status , Patient's Chart, lab work & pertinent test results, reviewed documented beta blocker date and time   Airway Mallampati: II TM Distance: >3 FB Neck ROM: full    Dental   Pulmonary neg pulmonary ROS,  breath sounds clear to auscultation        Cardiovascular hypertension, On Medications Rhythm:regular     Neuro/Psych PSYCHIATRIC DISORDERS Depression negative neurological ROS     GI/Hepatic negative GI ROS, Neg liver ROS,   Endo/Other  negative endocrine ROS  Renal/GU negative Renal ROS  negative genitourinary   Musculoskeletal   Abdominal   Peds  Hematology negative hematology ROS (+)   Anesthesia Other Findings See surgeon's H&P   Reproductive/Obstetrics negative OB ROS                           Anesthesia Physical Anesthesia Plan  ASA: II  Anesthesia Plan: General   Post-op Pain Management:    Induction: Intravenous  Airway Management Planned: Oral ETT  Additional Equipment:   Intra-op Plan:   Post-operative Plan: Extubation in OR  Informed Consent: I have reviewed the patients History and Physical, chart, labs and discussed the procedure including the risks, benefits and alternatives for the proposed anesthesia with the patient or authorized representative who has indicated his/her understanding and acceptance.   Dental Advisory Given  Plan Discussed with: CRNA and Surgeon  Anesthesia Plan Comments:         Anesthesia Quick Evaluation  

## 2012-10-05 ENCOUNTER — Encounter (HOSPITAL_COMMUNITY): Payer: Self-pay | Admitting: Neurological Surgery

## 2012-11-18 DIAGNOSIS — M545 Low back pain: Secondary | ICD-10-CM | POA: Diagnosis not present

## 2012-11-18 DIAGNOSIS — M5137 Other intervertebral disc degeneration, lumbosacral region: Secondary | ICD-10-CM | POA: Diagnosis not present

## 2012-11-18 DIAGNOSIS — M5126 Other intervertebral disc displacement, lumbar region: Secondary | ICD-10-CM | POA: Diagnosis not present

## 2012-11-18 DIAGNOSIS — M6281 Muscle weakness (generalized): Secondary | ICD-10-CM | POA: Diagnosis not present

## 2012-11-18 DIAGNOSIS — M25669 Stiffness of unspecified knee, not elsewhere classified: Secondary | ICD-10-CM | POA: Diagnosis not present

## 2012-11-25 ENCOUNTER — Ambulatory Visit (INDEPENDENT_AMBULATORY_CARE_PROVIDER_SITE_OTHER): Payer: Medicare Other | Admitting: Sports Medicine

## 2012-11-25 VITALS — BP 150/100 | Ht 71.0 in | Wt 178.0 lb

## 2012-11-25 DIAGNOSIS — M109 Gout, unspecified: Secondary | ICD-10-CM

## 2012-11-25 NOTE — Progress Notes (Signed)
  Subjective:    Patient ID: Daltin Crist, male    DOB: 17-Sep-1966, 47 y.o.   MRN: [id number]  HPI chief complaint: Bilateral knee pain and swelling  Patient comes in today complaining of bilateral knee pain and swelling. He has a history of gout and feels like his symptoms are identical in nature to his previous gouty attacks. He is on allopurinol chronically but despite this he claims that he get 3-4 gouty attacks a year. His pain is usually responds to Celebrex. He denies any recent trauma. No fevers or chills.    Review of Systems     Objective:   Physical Exam Well-developed, well-nourished. No acute distress. Awake alert and oriented x3  Left knee: Range of motion 0-120. Trace effusion. Joint is slightly warm to touch. Diffuse tenderness to palpation. Joint is stable to ligamentous exam grossly. Neurovascularly intact distally.  Right knee: Range of motion 0-120. Trace effusion. Joint is slightly warm to touch. Diffuse tenderness to palpation. Joint is stable to ligamentous exam grossly. Neurovascularly intact distally.  Walking with an obvious limp       Assessment & Plan:  1. Bilateral knee pain and swelling likely secondary to gout  Patient is to resume using Celebrex until symptoms resolve. He is instructed not to take any other anti-inflammatory medicines while on Celebrex. He is to followup with his primary care physician at the [facility], Dr. Azucena Kuba, who provides his prescription for allopurinol. He may need a higher dose or possibly a change to Uloric.

## 2012-12-08 DIAGNOSIS — M5126 Other intervertebral disc displacement, lumbar region: Secondary | ICD-10-CM | POA: Diagnosis not present

## 2012-12-08 DIAGNOSIS — M5137 Other intervertebral disc degeneration, lumbosacral region: Secondary | ICD-10-CM | POA: Diagnosis not present

## 2012-12-08 DIAGNOSIS — M6281 Muscle weakness (generalized): Secondary | ICD-10-CM | POA: Diagnosis not present

## 2012-12-08 DIAGNOSIS — M545 Low back pain: Secondary | ICD-10-CM | POA: Diagnosis not present

## 2012-12-08 DIAGNOSIS — M25669 Stiffness of unspecified knee, not elsewhere classified: Secondary | ICD-10-CM | POA: Diagnosis not present

## 2013-08-19 DIAGNOSIS — IMO0002 Reserved for concepts with insufficient information to code with codable children: Secondary | ICD-10-CM | POA: Diagnosis not present

## 2013-08-19 DIAGNOSIS — S92919A Unspecified fracture of unspecified toe(s), initial encounter for closed fracture: Secondary | ICD-10-CM | POA: Diagnosis not present

## 2013-08-23 DIAGNOSIS — IMO0002 Reserved for concepts with insufficient information to code with codable children: Secondary | ICD-10-CM | POA: Diagnosis not present

## 2013-08-23 DIAGNOSIS — Y999 Unspecified external cause status: Secondary | ICD-10-CM | POA: Diagnosis not present

## 2013-08-23 DIAGNOSIS — Y939 Activity, unspecified: Secondary | ICD-10-CM | POA: Diagnosis not present

## 2013-08-23 DIAGNOSIS — Y929 Unspecified place or not applicable: Secondary | ICD-10-CM | POA: Diagnosis not present

## 2013-08-23 DIAGNOSIS — X58XXXA Exposure to other specified factors, initial encounter: Secondary | ICD-10-CM | POA: Diagnosis not present

## 2013-08-26 DIAGNOSIS — IMO0002 Reserved for concepts with insufficient information to code with codable children: Secondary | ICD-10-CM | POA: Diagnosis not present

## 2013-09-28 DIAGNOSIS — IMO0002 Reserved for concepts with insufficient information to code with codable children: Secondary | ICD-10-CM | POA: Diagnosis not present

## 2015-08-16 DIAGNOSIS — M5116 Intervertebral disc disorders with radiculopathy, lumbar region: Secondary | ICD-10-CM | POA: Diagnosis not present

## 2015-08-16 DIAGNOSIS — M545 Low back pain: Secondary | ICD-10-CM | POA: Diagnosis not present

## 2015-08-23 DIAGNOSIS — M5116 Intervertebral disc disorders with radiculopathy, lumbar region: Secondary | ICD-10-CM | POA: Diagnosis not present

## 2015-08-23 DIAGNOSIS — I10 Essential (primary) hypertension: Secondary | ICD-10-CM | POA: Diagnosis not present

## 2015-08-24 DIAGNOSIS — M545 Low back pain: Secondary | ICD-10-CM | POA: Diagnosis not present

## 2015-09-06 DIAGNOSIS — M545 Low back pain: Secondary | ICD-10-CM | POA: Diagnosis not present

## 2015-09-06 DIAGNOSIS — M5116 Intervertebral disc disorders with radiculopathy, lumbar region: Secondary | ICD-10-CM | POA: Diagnosis not present

## 2015-09-19 DIAGNOSIS — M5116 Intervertebral disc disorders with radiculopathy, lumbar region: Secondary | ICD-10-CM | POA: Diagnosis not present

## 2015-09-19 DIAGNOSIS — M545 Low back pain: Secondary | ICD-10-CM | POA: Diagnosis not present

## 2015-09-20 DIAGNOSIS — M545 Low back pain: Secondary | ICD-10-CM | POA: Diagnosis not present

## 2015-09-20 DIAGNOSIS — M5116 Intervertebral disc disorders with radiculopathy, lumbar region: Secondary | ICD-10-CM | POA: Diagnosis not present

## 2015-09-28 ENCOUNTER — Inpatient Hospital Stay (HOSPITAL_COMMUNITY): Payer: Medicare Other

## 2015-09-28 ENCOUNTER — Emergency Department (HOSPITAL_COMMUNITY): Payer: Medicare Other

## 2015-09-28 ENCOUNTER — Encounter (HOSPITAL_COMMUNITY): Payer: Self-pay | Admitting: *Deleted

## 2015-09-28 ENCOUNTER — Inpatient Hospital Stay (HOSPITAL_COMMUNITY)
Admission: EM | Admit: 2015-09-28 | Discharge: 2015-10-01 | DRG: 378 | Disposition: A | Discharge status: Home / Self Care | Payer: Medicare Other | Attending: Internal Medicine | Admitting: Internal Medicine

## 2015-09-28 ENCOUNTER — Encounter (HOSPITAL_COMMUNITY)
Admission: EM | Disposition: A | Discharge status: Home / Self Care | Payer: Self-pay | Source: Home / Self Care | Attending: Internal Medicine

## 2015-09-28 DIAGNOSIS — K920 Hematemesis: Secondary | ICD-10-CM | POA: Diagnosis not present

## 2015-09-28 DIAGNOSIS — F1290 Cannabis use, unspecified, uncomplicated: Secondary | ICD-10-CM | POA: Diagnosis present

## 2015-09-28 DIAGNOSIS — K2960 Other gastritis without bleeding: Secondary | ICD-10-CM | POA: Diagnosis present

## 2015-09-28 DIAGNOSIS — J811 Chronic pulmonary edema: Secondary | ICD-10-CM | POA: Diagnosis not present

## 2015-09-28 DIAGNOSIS — I10 Essential (primary) hypertension: Secondary | ICD-10-CM | POA: Diagnosis present

## 2015-09-28 DIAGNOSIS — M1990 Unspecified osteoarthritis, unspecified site: Secondary | ICD-10-CM | POA: Diagnosis present

## 2015-09-28 DIAGNOSIS — F1010 Alcohol abuse, uncomplicated: Secondary | ICD-10-CM | POA: Diagnosis not present

## 2015-09-28 DIAGNOSIS — E876 Hypokalemia: Secondary | ICD-10-CM | POA: Diagnosis present

## 2015-09-28 DIAGNOSIS — M549 Dorsalgia, unspecified: Secondary | ICD-10-CM | POA: Diagnosis present

## 2015-09-28 DIAGNOSIS — K922 Gastrointestinal hemorrhage, unspecified: Secondary | ICD-10-CM | POA: Diagnosis not present

## 2015-09-28 DIAGNOSIS — F4310 Post-traumatic stress disorder, unspecified: Secondary | ICD-10-CM | POA: Diagnosis present

## 2015-09-28 DIAGNOSIS — K250 Acute gastric ulcer with hemorrhage: Secondary | ICD-10-CM | POA: Diagnosis not present

## 2015-09-28 DIAGNOSIS — Z8711 Personal history of peptic ulcer disease: Secondary | ICD-10-CM | POA: Diagnosis not present

## 2015-09-28 DIAGNOSIS — M109 Gout, unspecified: Secondary | ICD-10-CM | POA: Diagnosis present

## 2015-09-28 DIAGNOSIS — K2210 Ulcer of esophagus without bleeding: Secondary | ICD-10-CM | POA: Diagnosis present

## 2015-09-28 DIAGNOSIS — K210 Gastro-esophageal reflux disease with esophagitis: Secondary | ICD-10-CM | POA: Diagnosis not present

## 2015-09-28 DIAGNOSIS — K269 Duodenal ulcer, unspecified as acute or chronic, without hemorrhage or perforation: Secondary | ICD-10-CM | POA: Diagnosis not present

## 2015-09-28 DIAGNOSIS — K264 Chronic or unspecified duodenal ulcer with hemorrhage: Secondary | ICD-10-CM | POA: Diagnosis present

## 2015-09-28 DIAGNOSIS — K260 Acute duodenal ulcer with hemorrhage: Secondary | ICD-10-CM | POA: Diagnosis not present

## 2015-09-28 DIAGNOSIS — R1084 Generalized abdominal pain: Secondary | ICD-10-CM | POA: Diagnosis not present

## 2015-09-28 DIAGNOSIS — D62 Acute posthemorrhagic anemia: Secondary | ICD-10-CM | POA: Diagnosis present

## 2015-09-28 HISTORY — PX: ESOPHAGOGASTRODUODENOSCOPY: SHX5428

## 2015-09-28 HISTORY — DX: Alcohol abuse, uncomplicated: F10.10

## 2015-09-28 LAB — CBC
HCT: 28.6 % — ABNORMAL LOW (ref 39.0–52.0)
HCT: 27.8 % — ABNORMAL LOW (ref 39.0–52.0)
HEMATOCRIT: 24.6 % — AB (ref 39.0–52.0)
HEMOGLOBIN: 9.7 g/dL — AB (ref 13.0–17.0)
HEMOGLOBIN: 8.3 g/dL — AB (ref 13.0–17.0)
Hemoglobin: 9.5 g/dL — ABNORMAL LOW (ref 13.0–17.0)
MCH: 32.2 pg (ref 26.0–34.0)
MCH: 31.8 pg (ref 26.0–34.0)
MCH: 31.4 pg (ref 26.0–34.0)
MCHC: 33.9 g/dL (ref 30.0–36.0)
MCHC: 34.2 g/dL (ref 30.0–36.0)
MCHC: 33.7 g/dL (ref 30.0–36.0)
MCV: 95 fL (ref 78.0–100.0)
MCV: 93 fL (ref 78.0–100.0)
MCV: 93.2 fL (ref 78.0–100.0)
PLATELETS: 263 10*3/uL (ref 150–400)
Platelets: 284 10*3/uL (ref 150–400)
Platelets: 219 10*3/uL (ref 150–400)
RBC: 3.01 MIL/uL — AB (ref 4.22–5.81)
RBC: 2.99 MIL/uL — AB (ref 4.22–5.81)
RBC: 2.64 MIL/uL — AB (ref 4.22–5.81)
RDW: 13.6 % (ref 11.5–15.5)
RDW: 13.4 % (ref 11.5–15.5)
RDW: 13.6 % (ref 11.5–15.5)
WBC: 16.3 10*3/uL — AB (ref 4.0–10.5)
WBC: 12.4 10*3/uL — AB (ref 4.0–10.5)
WBC: 7.9 10*3/uL (ref 4.0–10.5)

## 2015-09-28 LAB — COMPREHENSIVE METABOLIC PANEL
ALBUMIN: 3 g/dL — AB (ref 3.5–5.0)
ALK PHOS: 55 U/L (ref 38–126)
ALT: 42 U/L (ref 17–63)
AST: 22 U/L (ref 15–41)
Anion gap: 7 (ref 5–15)
BILIRUBIN TOTAL: 0.8 mg/dL (ref 0.3–1.2)
BUN: 31 mg/dL — AB (ref 6–20)
CO2: 19 mmol/L — ABNORMAL LOW (ref 22–32)
Calcium: 7.4 mg/dL — ABNORMAL LOW (ref 8.9–10.3)
Chloride: 108 mmol/L (ref 101–111)
Creatinine, Ser: 0.67 mg/dL (ref 0.61–1.24)
GFR calc Af Amer: >60 mL/min (ref >60)
GFR calc non Af Amer: >60 mL/min (ref >60)
GLUCOSE: 187 mg/dL — AB (ref 65–99)
POTASSIUM: 4.5 mmol/L (ref 3.5–5.1)
Sodium: 134 mmol/L — ABNORMAL LOW (ref 135–145)
TOTAL PROTEIN: 5.3 g/dL — AB (ref 6.5–8.1)

## 2015-09-28 LAB — TROPONIN I
Troponin I: <0.03 ng/mL (ref <0.031)
Troponin I: <0.03 ng/mL (ref <0.031)

## 2015-09-28 LAB — PROTIME-INR
INR: 1.2 (ref 0.00–1.49)
INR: 1.19 (ref 0.00–1.49)
PROTHROMBIN TIME: 15.2 s (ref 11.6–15.2)
Prothrombin Time: 15.3 seconds — ABNORMAL HIGH (ref 11.6–15.2)

## 2015-09-28 LAB — PROCALCITONIN: Procalcitonin: <0.1 ng/mL

## 2015-09-28 LAB — HEPATIC FUNCTION PANEL
ALBUMIN: 3.2 g/dL — AB (ref 3.5–5.0)
ALK PHOS: 52 U/L (ref 38–126)
ALT: 50 U/L (ref 17–63)
ALT: 40 U/L (ref 17–63)
AST: 28 U/L (ref 15–41)
AST: 22 U/L (ref 15–41)
Albumin: 2.5 g/dL — ABNORMAL LOW (ref 3.5–5.0)
Alkaline Phosphatase: 62 U/L (ref 38–126)
BILIRUBIN INDIRECT: 0.7 mg/dL (ref 0.3–0.9)
BILIRUBIN TOTAL: 1.3 mg/dL — AB (ref 0.3–1.2)
Bilirubin, Direct: 0.2 mg/dL (ref 0.1–0.5)
Bilirubin, Direct: 0.2 mg/dL (ref 0.1–0.5)
Indirect Bilirubin: 1.1 mg/dL — ABNORMAL HIGH (ref 0.3–0.9)
TOTAL PROTEIN: 4.5 g/dL — AB (ref 6.5–8.1)
Total Bilirubin: 0.9 mg/dL (ref 0.3–1.2)
Total Protein: 5.8 g/dL — ABNORMAL LOW (ref 6.5–8.1)

## 2015-09-28 LAB — CBC WITH DIFFERENTIAL/PLATELET
Basophils Absolute: 0 10*3/uL (ref 0.0–0.1)
Basophils Relative: 0 %
Eosinophils Absolute: 0.3 10*3/uL (ref 0.0–0.7)
Eosinophils Relative: 3 %
HEMATOCRIT: 33.6 % — AB (ref 39.0–52.0)
HEMOGLOBIN: 11.2 g/dL — AB (ref 13.0–17.0)
LYMPHS ABS: 2.6 10*3/uL (ref 0.7–4.0)
LYMPHS PCT: 26 %
MCH: 31.3 pg (ref 26.0–34.0)
MCHC: 33.3 g/dL (ref 30.0–36.0)
MCV: 93.9 fL (ref 78.0–100.0)
Monocytes Absolute: 0.7 10*3/uL (ref 0.1–1.0)
Monocytes Relative: 7 %
NEUTROS PCT: 64 %
Neutro Abs: 6.3 10*3/uL (ref 1.7–7.7)
Platelets: 282 10*3/uL (ref 150–400)
RBC: 3.58 MIL/uL — AB (ref 4.22–5.81)
RDW: 13.5 % (ref 11.5–15.5)
WBC: 9.9 10*3/uL (ref 4.0–10.5)

## 2015-09-28 LAB — I-STAT CHEM 8, ED
BUN: 34 mg/dL — AB (ref 6–20)
CALCIUM ION: 1.18 mmol/L (ref 1.12–1.23)
CHLORIDE: 100 mmol/L — AB (ref 101–111)
CREATININE: 0.8 mg/dL (ref 0.61–1.24)
GLUCOSE: 163 mg/dL — AB (ref 65–99)
HCT: 36 % — ABNORMAL LOW (ref 39.0–52.0)
Hemoglobin: 12.2 g/dL — ABNORMAL LOW (ref 13.0–17.0)
Potassium: 3.7 mmol/L (ref 3.5–5.1)
SODIUM: 137 mmol/L (ref 135–145)
TCO2: 23 mmol/L (ref 0–100)

## 2015-09-28 LAB — POC OCCULT BLOOD, ED: Fecal Occult Bld: POSITIVE — AB

## 2015-09-28 LAB — AMYLASE: Amylase: 88 U/L (ref 28–100)

## 2015-09-28 LAB — LACTIC ACID, PLASMA: Lactic Acid, Venous: 1.2 mmol/L (ref 0.5–2.0)

## 2015-09-28 LAB — MRSA PCR SCREENING: MRSA by PCR: POSITIVE — AB

## 2015-09-28 LAB — TYPE AND SCREEN
ABO/RH(D): A NEG
ANTIBODY SCREEN: NEGATIVE

## 2015-09-28 LAB — MAGNESIUM: Magnesium: 1.5 mg/dL — ABNORMAL LOW (ref 1.7–2.4)

## 2015-09-28 LAB — LIPASE, BLOOD: LIPASE: 24 U/L (ref 11–51)

## 2015-09-28 LAB — APTT: APTT: 27 s (ref 24–37)

## 2015-09-28 LAB — PHOSPHORUS: Phosphorus: 2 mg/dL — ABNORMAL LOW (ref 2.5–4.6)

## 2015-09-28 LAB — ABO/RH: ABO/RH(D): A NEG

## 2015-09-28 SURGERY — EGD (ESOPHAGOGASTRODUODENOSCOPY)
Anesthesia: Moderate Sedation

## 2015-09-28 MED ORDER — MIDAZOLAM HCL 5 MG/ML IJ SOLN
INTRAMUSCULAR | Status: AC
  Filled 2015-09-28: qty 2

## 2015-09-28 MED ORDER — MAGNESIUM SULFATE IN D5W 10-5 MG/ML-% IV SOLN
1.0000 g | Freq: Once | INTRAVENOUS | Status: AC
  Administered 2015-09-28: 1 g via INTRAVENOUS
  Filled 2015-09-28: qty 100

## 2015-09-28 MED ORDER — ONDANSETRON HCL 4 MG/2ML IJ SOLN
4.0000 mg | Freq: Four times a day (QID) | INTRAMUSCULAR | Status: DC | PRN
  Administered 2015-09-28 – 2015-09-29 (×3): 4 mg via INTRAVENOUS
  Filled 2015-09-28 (×3): qty 2

## 2015-09-28 MED ORDER — PANTOPRAZOLE SODIUM 40 MG IV SOLR: 40.0000 mg | Freq: Two times a day (BID) | INTRAVENOUS | Status: DC

## 2015-09-28 MED ORDER — SODIUM CHLORIDE 0.9 % IV SOLN: 250.0000 mL | INTRAVENOUS | Status: DC | PRN

## 2015-09-28 MED ORDER — ADULT MULTIVITAMIN W/MINERALS CH
1.0000 | ORAL_TABLET | Freq: Every day | ORAL | Status: DC
  Administered 2015-09-29 – 2015-10-01 (×3): 1 via ORAL
  Filled 2015-09-28 (×3): qty 1

## 2015-09-28 MED ORDER — THIAMINE HCL 100 MG/ML IJ SOLN: 100.0000 mg | Freq: Every day | INTRAMUSCULAR | Status: DC

## 2015-09-28 MED ORDER — FOLIC ACID 5 MG/ML IJ SOLN
1.0000 mg | Freq: Every day | INTRAMUSCULAR | Status: DC
  Administered 2015-09-28: 1 mg via INTRAVENOUS
  Filled 2015-09-28 (×4): qty 0.2

## 2015-09-28 MED ORDER — PANTOPRAZOLE SODIUM 40 MG IV SOLR
80.0000 mg | Freq: Once | INTRAVENOUS | Status: AC
  Administered 2015-09-28: 80 mg via INTRAVENOUS
  Filled 2015-09-28: qty 80

## 2015-09-28 MED ORDER — EPINEPHRINE HCL 0.1 MG/ML IJ SOSY
PREFILLED_SYRINGE | INTRAMUSCULAR | Status: AC
  Filled 2015-09-28: qty 10

## 2015-09-28 MED ORDER — THIAMINE HCL 100 MG/ML IJ SOLN
100.0000 mg | Freq: Every day | INTRAMUSCULAR | Status: DC
  Administered 2015-09-28: 100 mg via INTRAVENOUS
  Filled 2015-09-28: qty 2

## 2015-09-28 MED ORDER — FOLIC ACID 5 MG/ML IJ SOLN
1.0000 mg | Freq: Every day | INTRAMUSCULAR | Status: DC
  Filled 2015-09-28: qty 0.2

## 2015-09-28 MED ORDER — FOLIC ACID 1 MG PO TABS: 1.0000 mg | ORAL_TABLET | Freq: Every day | ORAL | Status: DC

## 2015-09-28 MED ORDER — FENTANYL CITRATE (PF) 100 MCG/2ML IJ SOLN
INTRAMUSCULAR | Status: AC
  Filled 2015-09-28: qty 2

## 2015-09-28 MED ORDER — DIPHENHYDRAMINE HCL 50 MG/ML IJ SOLN
INTRAMUSCULAR | Status: AC
  Filled 2015-09-28: qty 1

## 2015-09-28 MED ORDER — METOCLOPRAMIDE HCL 5 MG/ML IJ SOLN
10.0000 mg | Freq: Once | INTRAMUSCULAR | Status: AC
  Administered 2015-09-28: 10 mg via INTRAVENOUS
  Filled 2015-09-28: qty 2

## 2015-09-28 MED ORDER — SODIUM CHLORIDE 0.9 % IV SOLN
INTRAVENOUS | Status: DC
  Administered 2015-09-28 – 2015-09-30 (×3): via INTRAVENOUS

## 2015-09-28 MED ORDER — SODIUM CHLORIDE 0.9 % IV BOLUS (SEPSIS)
2000.0000 mL | Freq: Once | INTRAVENOUS | Status: AC
  Administered 2015-09-28: 2000 mL via INTRAVENOUS

## 2015-09-28 MED ORDER — OCTREOTIDE LOAD VIA INFUSION: 50.0000 ug | Freq: Once | INTRAVENOUS | Status: DC

## 2015-09-28 MED ORDER — ONDANSETRON HCL 4 MG/2ML IJ SOLN
4.0000 mg | Freq: Once | INTRAMUSCULAR | Status: AC
  Administered 2015-09-28: 4 mg via INTRAVENOUS
  Filled 2015-09-28: qty 2

## 2015-09-28 MED ORDER — VITAMIN B-1 100 MG PO TABS: 100.0000 mg | ORAL_TABLET | Freq: Every day | ORAL | Status: DC

## 2015-09-28 MED ORDER — MIDAZOLAM HCL 10 MG/2ML IJ SOLN
INTRAMUSCULAR | Status: DC | PRN
  Administered 2015-09-28 (×3): 1 mg via INTRAVENOUS
  Administered 2015-09-28: 2 mg via INTRAVENOUS

## 2015-09-28 MED ORDER — OCTREOTIDE LOAD VIA INFUSION
50.0000 ug | Freq: Once | INTRAVENOUS | Status: AC
  Administered 2015-09-28: 50 ug via INTRAVENOUS
  Filled 2015-09-28: qty 25

## 2015-09-28 MED ORDER — SODIUM CHLORIDE 0.9 % IV SOLN
8.0000 mg/h | INTRAVENOUS | Status: AC
  Administered 2015-09-28 – 2015-10-01 (×7): 8 mg/h via INTRAVENOUS
  Filled 2015-09-28 (×14): qty 80

## 2015-09-28 MED ORDER — GI COCKTAIL ~~LOC~~
30.0000 mL | Freq: Once | ORAL | Status: AC
  Administered 2015-09-28: 30 mL via ORAL
  Filled 2015-09-28: qty 30

## 2015-09-28 MED ORDER — FENTANYL CITRATE (PF) 100 MCG/2ML IJ SOLN
INTRAMUSCULAR | Status: DC | PRN
  Administered 2015-09-28: 12.5 ug via INTRAVENOUS
  Administered 2015-09-28: 25 ug via INTRAVENOUS
  Administered 2015-09-28: 12.5 ug via INTRAVENOUS
  Administered 2015-09-28 (×2): 25 ug via INTRAVENOUS

## 2015-09-28 MED ORDER — VITAMIN B-1 100 MG PO TABS
100.0000 mg | ORAL_TABLET | Freq: Every day | ORAL | Status: DC
  Administered 2015-09-29 – 2015-10-01 (×3): 100 mg via ORAL
  Filled 2015-09-28 (×3): qty 1

## 2015-09-28 MED ORDER — SODIUM CHLORIDE 0.9 % IV SOLN
10.0000 mL/h | Freq: Once | INTRAVENOUS | Status: AC
  Administered 2015-09-28: 10 mL/h via INTRAVENOUS

## 2015-09-28 MED ORDER — CHLORHEXIDINE GLUCONATE CLOTH 2 % EX PADS
6.0000 | MEDICATED_PAD | Freq: Every day | CUTANEOUS | Status: DC
  Administered 2015-09-29 – 2015-10-01 (×3): 6 via TOPICAL

## 2015-09-28 MED ORDER — DIPHENHYDRAMINE HCL 50 MG/ML IJ SOLN
INTRAMUSCULAR | Status: DC | PRN
  Administered 2015-09-28: 25 mg via INTRAVENOUS

## 2015-09-28 MED ORDER — SODIUM CHLORIDE 0.9 % IJ SOLN
PREFILLED_SYRINGE | INTRAMUSCULAR | Status: DC | PRN
  Administered 2015-09-28: 8 mL

## 2015-09-28 MED ORDER — OCTREOTIDE ACETATE 500 MCG/ML IJ SOLN
50.0000 ug/h | INTRAMUSCULAR | Status: DC
  Administered 2015-09-28: 50 ug/h via INTRAVENOUS
  Filled 2015-09-28: qty 1

## 2015-09-28 MED ORDER — FOLIC ACID 1 MG PO TABS
1.0000 mg | ORAL_TABLET | Freq: Every day | ORAL | Status: DC
  Administered 2015-09-29 – 2015-10-01 (×3): 1 mg via ORAL
  Filled 2015-09-28 (×3): qty 1

## 2015-09-28 MED ORDER — BUTAMBEN-TETRACAINE-BENZOCAINE 2-2-14 % EX AERO
INHALATION_SPRAY | CUTANEOUS | Status: DC | PRN
  Administered 2015-09-28: 2 via TOPICAL

## 2015-09-28 MED ORDER — MUPIROCIN 2 % EX OINT
1.0000 "application " | TOPICAL_OINTMENT | Freq: Two times a day (BID) | CUTANEOUS | Status: DC
  Administered 2015-09-28 – 2015-10-01 (×6): 1 via NASAL
  Filled 2015-09-28 (×3): qty 22

## 2015-09-28 NOTE — Op Note (Signed)
[facility] [place], [address]   ENDOSCOPY PROCEDURE REPORT  PATIENT: Jared, [name]  MR#: [id number] BIRTHDATE: 06-12-1966 , 49  yrs. old GENDER: male ENDOSCOPIST: Wilford Corner, MD REFERRED BY: PROCEDURE DATE:  19-Oct-2015 PROCEDURE:  EGD w/ control of bleeding ASA CLASS:     Class II INDICATIONS:  hematemesis. MEDICATIONS: Fentanyl 100 mcg IV, Versed 5 mg IV, Benadryl 25 mg IV, and 8cc epinephrine:saline 1:10,000U submucosal injection TOPICAL ANESTHETIC: Cetacaine Spray X 2  DESCRIPTION OF PROCEDURE: After the risks benefits and alternatives of the procedure were thoroughly explained, informed consent was obtained.  The Pentax Gastroscope [id number] endoscope was introduced through the mouth and advanced to the second portion of the duodenum , Without limitations.  The instrument was slowly withdrawn as the mucosa was fully examined. Estimated blood loss is zero unless otherwise noted in this procedure report.    Minimal edema and superficial ulceration in distal esophagus c/w mild erosive esophagitis. GEJ 42 cm from the incisors. Moderate amount of dark brown particles in the dependent portion of the stomach. Small superficial ulcer (3 mm) seen in the antrum with surrounding edema c/w antral gastritis. Ulcer was friable and a small amount of oozing of blood developed during the EGD. An 8 mm ulcer seen in the duodenal bulb with an adherent clot and surrounding edema and erythema. 2nd portion of the duodenum was normal in appearance. Small amount of bleeding developed at the distal edge of the DU during repeated irrigation to dislodge the adherent clot. Parts of the clot were dislodged revealing a clean base but the parts of the clot remained preventing complete visualization of the ulcer bed. 5 cc of epinephrine:saline 1:10,000 U mixture was injected submucosally around the duodenal ulcer with good blanching and hemostasis achieved.  Small amount of oozing began from the gastric ulcer and 3 cc of [id number] mixture was injected submucosally with complete hemostasis achieved and good blanching noted.         Retroflexed views revealed no abnormalities.     The scope was then withdrawn from the patient and the procedure completed.  COMPLICATIONS: There were no immediate complications.  ENDOSCOPIC IMPRESSION:     Duodenal ulcer bleed with adherent clot - s/p epi injection (see above) Gastric antral ulcer Erosive esophagitis Antral gastritis  RECOMMENDATIONS:     Continue Protonix drip; NPO except ice chips and sips with meds; Check H. pylori serology and treat if positive; Supportive care; Follow H/Hs   eSigned:  Wilford Corner, MD 2015/10/19 9:36 AM    CC:  CPT CODES: ICD CODES:  The ICD and CPT codes recommended by this software are interpretations from the data that the clinical staff has captured with the software.  The verification of the translation of this report to the ICD and CPT codes and modifiers is the sole responsibility of the health care institution and practicing physician where this report was generated.  [employer] will not be held responsible for the validity of the ICD and CPT codes included on this report.  AMA assumes no liability for data contained or not contained herein. CPT is a Designer, television/film set of the [employer].  PATIENT NAME:  Jared, [name] MR#: [id number]

## 2015-09-28 NOTE — H&P (View-Only) (Signed)
Referring Provider: Dr. Randal Buba Primary Care Physician:  Shellia Cleverly, DO Primary Gastroenterologist:  Althia Forts  Reason for Consultation:  GI bleed  HPI: Jared [name] is a 49 y.o. male seen for a consultation due to 3-4 episodes of black stools and one episode of coffee grounds emesis. Denies associated abdominal pain, hematochezia, or red blood in his vomitus. Has been having a burning sensation in his epigastric region for the past week. Drinks a 6 pack of beer per week. Denies NSAIDs. Remote history of peptic ulcer disease in the early 1990's. Hgb 11.2. BUN 34.  Past Medical History  Diagnosis Date  . Hypertension 1984  . Depression   . PTSD (post-traumatic stress disorder)     Nature conservation officer; retired Corporate treasurer  . Arthritis   . Gout     Past Surgical History  Procedure Laterality Date  . Knee arthroscopy  [id number]    bilateral  . Ankle arthroscopy  2005.2006    bilateral  . Bunionectomy  1994    rt   foot  . Tonsillectomy    . Lumbar laminectomy/decompression microdiscectomy  09/29/2012    Procedure: LUMBAR LAMINECTOMY/DECOMPRESSION MICRODISCECTOMY 2 LEVELS;  Surgeon: Kristeen Miss, MD;  Location: [facility];  Service: Neurosurgery;  Laterality: Left;  Left Lumbar four-five, Lumbar five-Sacral one Microdiskectomy    Prior to Admission medications   Medication Sig Start Date End Date Taking? Authorizing Provider  allopurinol (ZYLOPRIM) 300 MG tablet Take 300 mg by mouth daily.   Yes Historical Provider, MD  cholecalciferol (VITAMIN D) 1000 UNITS tablet Take 1,000 Units by mouth daily.   Yes Historical Provider, MD  lisinopril (PRINIVIL,ZESTRIL) 20 MG tablet Take 20 mg by mouth daily.   Yes Historical Provider, MD    Scheduled Meds: . folic acid  1 mg Oral Daily  . folic acid  1 mg Intravenous Daily  . multivitamin with minerals  1 tablet Oral Daily  . [START ON 10/01/2015] pantoprazole (PROTONIX) IV  40 mg Intravenous Q12H  . thiamine  100 mg Intravenous Daily   . thiamine  100 mg Oral Daily   Continuous Infusions: . sodium chloride    . sodium chloride    . octreotide  (SANDOSTATIN)    IV infusion 50 mcg/hr (09/28/15 0706)  . pantoprozole (PROTONIX) infusion 8 mg/hr (09/28/15 0647)   PRN Meds:.sodium chloride, ondansetron (ZOFRAN) IV  Allergies as of 09/28/2015  . (No Known Allergies)    History reviewed. No pertinent family history.  Social History   Social History  . Marital Status: Married    Spouse Name: N/A  . Number of Children: N/A  . Years of Education: N/A   Occupational History  . Not on file.   Social History Main Topics  . Smoking status: Never Smoker   . Smokeless tobacco: Never Used  . Alcohol Use: 3.6 oz/week    6 Cans of beer per week     Comment: socially  . Drug Use: Yes    Special: Marijuana  . Sexual Activity: Not on file   Other Topics Concern  . Not on file   Social History Narrative    Review of Systems: All negative except as stated above in HPI.  Physical Exam: Vital signs: Filed Vitals:   09/28/15 0755 09/28/15 0800  BP: 111/78 115/72  Pulse: 84 88  Temp: 98   Resp: 10 14     General:   Alert,  Well-developed, well-nourished, pleasant and cooperative in NAD Head: atraumatic Eyes: anicteric sclera ENT: oropharynx clear Neck:  supple, nontender Lungs:  Clear throughout to auscultation.   No wheezes, crackles, or rhonchi. No acute distress. Heart:  Regular rate and rhythm; no murmurs, clicks, rubs,  or gallops. Abdomen: soft, nontender, nondistended, +BS  Rectal:  Deferred Ext: no edema Skin: no rash  GI:  Lab Results:  Recent Labs  09/28/15 0600 09/28/15 0619  WBC 9.9  --   HGB 11.2* 12.2*  HCT 33.6* 36.0*  PLT 282  --    BMET  Recent Labs  09/28/15 0619  NA 137  K 3.7  CL 100*  GLUCOSE 163*  BUN 34*  CREATININE 0.80   LFT  Recent Labs  09/28/15 0646  PROT 4.5*  ALBUMIN 2.5*  AST 22  ALT 40  ALKPHOS 52  BILITOT 0.9  BILIDIR 0.2  IBILI 0.7    PT/INR  Recent Labs  09/28/15 0646  LABPROT 15.3*  INR 1.20     Studies/Results: Dg Abd Acute W/chest  09/28/2015  CLINICAL DATA:  Generalized abdominal burning for 2 weeks. Nausea and vomiting. Initial encounter. EXAM: DG ABDOMEN ACUTE W/ 1V CHEST COMPARISON:  Chest radiograph performed 09/24/2012 FINDINGS: The lungs are well-aerated and clear. There is no evidence of focal opacification, pleural effusion or pneumothorax. The cardiomediastinal silhouette is within normal limits. The visualized bowel gas pattern is unremarkable. Scattered stool and air are seen within the colon; there is no evidence of small bowel dilatation to suggest obstruction. No free intra-abdominal air is identified on the provided upright view. No acute osseous abnormalities are seen; the sacroiliac joints are unremarkable in appearance. IMPRESSION: 1. Unremarkable bowel gas pattern; no free intra-abdominal air seen. Small amount of stool noted in the colon. 2. No acute cardiopulmonary process seen. Electronically Signed   By: Garald Balding M.D.   On: 09/28/2015 06:53    Impression/Plan: GI bleed likely upper source such as peptic ulcer disease. Doubt variceal bleed. Bedside EGD. Protonix drip. Supportive care. Follow H/Hs.    LOS: 0 days   [place] C.  09/28/2015, 8:17 AM  Pager [phone]  If no answer or after 5 PM call [phone]

## 2015-09-28 NOTE — ED Notes (Signed)
Endo at bedside, performing procedure  Will draw blood work after procedure completed.

## 2015-09-28 NOTE — Interval H&P Note (Signed)
History and Physical Interval Note:  09/28/2015 8:25 AM  Jared [name]  has presented today for surgery, with the diagnosis of GI Bleed  The various methods of treatment have been discussed with the patient and family. After consideration of risks, benefits and other options for treatment, the patient has consented to  Procedure(s): ESOPHAGOGASTRODUODENOSCOPY (EGD) (N/A) as a surgical intervention .  The patient's history has been reviewed, patient examined, no change in status, stable for surgery.  I have reviewed the patient's chart and labs.  Questions were answered to the patient's satisfaction.     [place] C.

## 2015-09-28 NOTE — ED Notes (Signed)
Pt transfer delayed due to bedside endoscopy , ICU RN made aware.

## 2015-09-28 NOTE — ED Notes (Signed)
Pt states that he has had burning to his abd over the last 2 weeks; pt states that last night he began having dark tarry stools and then this am pt c/o N/V; pt reports dark emesis "like what I had coming out the other end"; pt continues to complain of burning to his abd; pt c/o nausea

## 2015-09-28 NOTE — ED Notes (Signed)
Endoscopy procedure  at bedside.

## 2015-09-28 NOTE — Consult Note (Signed)
Referring Provider: Dr. Randal Buba Primary Care Physician:  Shellia Cleverly, DO Primary Gastroenterologist:  Althia Forts  Reason for Consultation:  GI bleed  HPI: Jared [name] is a 49 y.o. male seen for a consultation due to 3-4 episodes of black stools and one episode of coffee grounds emesis. Denies associated abdominal pain, hematochezia, or red blood in his vomitus. Has been having a burning sensation in his epigastric region for the past week. Drinks a 6 pack of beer per week. Denies NSAIDs. Remote history of peptic ulcer disease in the early 1990's. Hgb 11.2. BUN 34.  Past Medical History  Diagnosis Date  . Hypertension 1984  . Depression   . PTSD (post-traumatic stress disorder)     Nature conservation officer; retired Corporate treasurer  . Arthritis   . Gout     Past Surgical History  Procedure Laterality Date  . Knee arthroscopy  [id number]    bilateral  . Ankle arthroscopy  2005.2006    bilateral  . Bunionectomy  1994    rt   foot  . Tonsillectomy    . Lumbar laminectomy/decompression microdiscectomy  09/29/2012    Procedure: LUMBAR LAMINECTOMY/DECOMPRESSION MICRODISCECTOMY 2 LEVELS;  Surgeon: Kristeen Miss, MD;  Location: [facility];  Service: Neurosurgery;  Laterality: Left;  Left Lumbar four-five, Lumbar five-Sacral one Microdiskectomy    Prior to Admission medications   Medication Sig Start Date End Date Taking? Authorizing Provider  allopurinol (ZYLOPRIM) 300 MG tablet Take 300 mg by mouth daily.   Yes Historical Provider, MD  cholecalciferol (VITAMIN D) 1000 UNITS tablet Take 1,000 Units by mouth daily.   Yes Historical Provider, MD  lisinopril (PRINIVIL,ZESTRIL) 20 MG tablet Take 20 mg by mouth daily.   Yes Historical Provider, MD    Scheduled Meds: . folic acid  1 mg Oral Daily  . folic acid  1 mg Intravenous Daily  . multivitamin with minerals  1 tablet Oral Daily  . [START ON 10/01/2015] pantoprazole (PROTONIX) IV  40 mg Intravenous Q12H  . thiamine  100 mg Intravenous Daily   . thiamine  100 mg Oral Daily   Continuous Infusions: . sodium chloride    . sodium chloride    . octreotide  (SANDOSTATIN)    IV infusion 50 mcg/hr (09/28/15 0706)  . pantoprozole (PROTONIX) infusion 8 mg/hr (09/28/15 0647)   PRN Meds:.sodium chloride, ondansetron (ZOFRAN) IV  Allergies as of 09/28/2015  . (No Known Allergies)    History reviewed. No pertinent family history.  Social History   Social History  . Marital Status: Married    Spouse Name: N/A  . Number of Children: N/A  . Years of Education: N/A   Occupational History  . Not on file.   Social History Main Topics  . Smoking status: Never Smoker   . Smokeless tobacco: Never Used  . Alcohol Use: 3.6 oz/week    6 Cans of beer per week     Comment: socially  . Drug Use: Yes    Special: Marijuana  . Sexual Activity: Not on file   Other Topics Concern  . Not on file   Social History Narrative    Review of Systems: All negative except as stated above in HPI.  Physical Exam: Vital signs: Filed Vitals:   09/28/15 0755 09/28/15 0800  BP: 111/78 115/72  Pulse: 84 88  Temp: 98   Resp: 10 14     General:   Alert,  Well-developed, well-nourished, pleasant and cooperative in NAD Head: atraumatic Eyes: anicteric sclera ENT: oropharynx clear Neck:  supple, nontender Lungs:  Clear throughout to auscultation.   No wheezes, crackles, or rhonchi. No acute distress. Heart:  Regular rate and rhythm; no murmurs, clicks, rubs,  or gallops. Abdomen: soft, nontender, nondistended, +BS  Rectal:  Deferred Ext: no edema Skin: no rash  GI:  Lab Results:  Recent Labs  09/28/15 0600 09/28/15 0619  WBC 9.9  --   HGB 11.2* 12.2*  HCT 33.6* 36.0*  PLT 282  --    BMET  Recent Labs  09/28/15 0619  NA 137  K 3.7  CL 100*  GLUCOSE 163*  BUN 34*  CREATININE 0.80   LFT  Recent Labs  09/28/15 0646  PROT 4.5*  ALBUMIN 2.5*  AST 22  ALT 40  ALKPHOS 52  BILITOT 0.9  BILIDIR 0.2  IBILI 0.7    PT/INR  Recent Labs  09/28/15 0646  LABPROT 15.3*  INR 1.20     Studies/Results: Dg Abd Acute W/chest  09/28/2015  CLINICAL DATA:  Generalized abdominal burning for 2 weeks. Nausea and vomiting. Initial encounter. EXAM: DG ABDOMEN ACUTE W/ 1V CHEST COMPARISON:  Chest radiograph performed 09/24/2012 FINDINGS: The lungs are well-aerated and clear. There is no evidence of focal opacification, pleural effusion or pneumothorax. The cardiomediastinal silhouette is within normal limits. The visualized bowel gas pattern is unremarkable. Scattered stool and air are seen within the colon; there is no evidence of small bowel dilatation to suggest obstruction. No free intra-abdominal air is identified on the provided upright view. No acute osseous abnormalities are seen; the sacroiliac joints are unremarkable in appearance. IMPRESSION: 1. Unremarkable bowel gas pattern; no free intra-abdominal air seen. Small amount of stool noted in the colon. 2. No acute cardiopulmonary process seen. Electronically Signed   By: Garald Balding M.D.   On: 09/28/2015 06:53    Impression/Plan: GI bleed likely upper source such as peptic ulcer disease. Doubt variceal bleed. Bedside EGD. Protonix drip. Supportive care. Follow H/Hs.    LOS: 0 days   [place] C.  09/28/2015, 8:17 AM  Pager [phone]  If no answer or after 5 PM call [phone]

## 2015-09-28 NOTE — ED Notes (Addendum)
Lab delay - pt having procedure done.  RN will notify me when they are done.

## 2015-09-28 NOTE — H&P (Signed)
PULMONARY / CRITICAL CARE MEDICINE   Name: Jared [name] MRN: [id number] DOB: Feb 20, 1966    ADMISSION DATE:  09/28/2015 CONSULTATION DATE:  09/28/2015   REFERRING MD :  April PalumboRausch  CHIEF COMPLAINT:  GI bleed  HISTORY OF PRESENT ILLNESS:   49 year old male with significant alcohol intake but denies any antacid or nonsteroidal anti-inflammatory or aspirin or Plavix intake. Has had epigastric pain for the last few weeks. Last night started vomiting dark colored vomitus and this morning started having melena. Brought into the emergency department. According to the bedside nurse in the emergency department was transiently hypotensive but responded to fluids and since then has been doing well. Patient is currently on octreotide and proton pump inhibitor infusion and is hemodynamically stable. Worst hemoglobin was only 11 g percent. Baseline hemoglobin unknown. Gastric oncology service is planning to endoscopy in the emergency department itself. Patient is not intubated and looks well. Critical care medicine being asked to admit the patient 09/28/2015  Events Admit 09/28/2015  PAST MEDICAL HISTORY :  He  has a past medical history of Hypertension (1984); Depression; PTSD (post-traumatic stress disorder); Arthritis; and Gout.  PAST SURGICAL HISTORY: He  has past surgical history that includes Knee arthroscopy [id number]); Ankle arthroscopy (2005.2006); Bunionectomy (1994); Tonsillectomy; and Lumbar laminectomy/decompression microdiscectomy (09/29/2012).  No Known Allergies  No current facility-administered medications on file prior to encounter.   Current Outpatient Prescriptions on File Prior to Encounter  Medication Sig  . allopurinol (ZYLOPRIM) 300 MG tablet Take 300 mg by mouth daily.  . cholecalciferol (VITAMIN D) 1000 UNITS tablet Take 1,000 Units by mouth daily.  Marland Kitchen lisinopril (PRINIVIL,ZESTRIL) 20 MG tablet Take 20 mg by mouth daily.    FAMILY HISTORY:  His has no  family status information on file.   SOCIAL HISTORY: He  reports that he has never smoked. He has never used smokeless tobacco. He reports that he drinks about 3.6 oz of alcohol per week. He reports that he uses illicit drugs (Marijuana).  REVIEW OF SYSTEMS:   According to history of present illness    VITAL SIGNS: BP 114/77 mmHg  Pulse 80  Temp(Src) 98.2 F (36.8 C) (Oral)  Resp 17  Ht 5\' 11"  (1.803 m)  Wt 78.019 kg (172 lb)  BMI 24.00 kg/m2  SpO2 99%    INTAKE / OUTPUT:    PHYSICAL EXAMINATION: General:  Looks well and sitting comfortably in the emergency room department stretcher Neuro:  Alert and oriented 3. Speech normal. Glass, scale 15. No delirium. Moves all fours HEENT:  Supple neck. Mucous membranes moist. No neck nodes. Does not look pale. Cardiovascular:  Regular rate and rhythm. Not tachycardic. Lungs:  Clear to auscultation bilaterally no increased work of breathing. Abdomen:  Soft. Nontender. No organomegaly. Musculoskeletal:  No cyanosis no clubbing no edema Skin:  Appears intact  LABS:  PULMONARY  Recent Labs Lab 09/28/15 0619  TCO2 23    CBC  Recent Labs Lab 09/28/15 0600 09/28/15 0619  HGB 11.2* 12.2*  HCT 33.6* 36.0*  WBC 9.9  --   PLT 282  --     COAGULATION  Recent Labs Lab 09/28/15 0646  INR 1.20    CARDIAC  No results for input(s): TROPONINI in the last 168 hours. No results for input(s): PROBNP in the last 168 hours.   CHEMISTRY  Recent Labs Lab 09/28/15 0619  NA 137  K 3.7  CL 100*  GLUCOSE 163*  BUN 34*  CREATININE 0.80   Estimated Creatinine Clearance: 117.7  mL/min (by C-G formula based on Cr of 0.8).   LIVER  Recent Labs Lab 09/28/15 0600 09/28/15 0646  AST 28 22  ALT 50 40  ALKPHOS 62 52  BILITOT 1.3* 0.9  PROT 5.8* 4.5*  ALBUMIN 3.2* 2.5*  INR  --  1.20     INFECTIOUS No results for input(s): LATICACIDVEN, PROCALCITON in the last 168 hours.   ENDOCRINE CBG (last 3)  No results for  input(s): GLUCAP in the last 72 hours.       IMAGING x48h  - image(s) personally visualized  -   highlighted in bold Dg Abd Acute W/chest  09/28/2015  CLINICAL DATA:  Generalized abdominal burning for 2 weeks. Nausea and vomiting. Initial encounter. EXAM: DG ABDOMEN ACUTE W/ 1V CHEST COMPARISON:  Chest radiograph performed 09/24/2012 FINDINGS: The lungs are well-aerated and clear. There is no evidence of focal opacification, pleural effusion or pneumothorax. The cardiomediastinal silhouette is within normal limits. The visualized bowel gas pattern is unremarkable. Scattered stool and air are seen within the colon; there is no evidence of small bowel dilatation to suggest obstruction. No free intra-abdominal air is identified on the provided upright view. No acute osseous abnormalities are seen; the sacroiliac joints are unremarkable in appearance. IMPRESSION: 1. Unremarkable bowel gas pattern; no free intra-abdominal air seen. Small amount of stool noted in the colon. 2. No acute cardiopulmonary process seen. Electronically Signed   By: Garald Balding M.D.   On: 09/28/2015 06:53      DISCUSSION: Upper GI bleed with low rockall score. Immediate endoscopy in the emergency department. Admit intensive care unit for 24 hours observation. If stable will go to hospitalist service 09/29/2015  ASSESSMENT / PLAN:  PULMONARY A: No Acute. Doubt will need intubation for endoscopy because he is not actively bleeding P:   Monitor Intubated for endoscopy if needed or if he aspirates and the rare event  CARDIOVASCULAR A:  At risk for hemodynamic instability but currently stable P:  Monitor  RENAL A:   At risk for renal insufficiency but currently stable P:   Hydrate and monitor  GASTROINTESTINAL A:   Upper GI bleed. Hemodynamically stable. Hemoglobin between 11 and 12 g percent at admission P:   Holding off on NG tube due to potential for varices Protonix infusion Octreotide   infusion Endoscopy in the emergency department by Dr. Wilford Corner CBC every 6 hours   HEMATOLOGIC A:   At risk for significant hemodynamic instability and anemia due to GI bleed P:  Hold off transfusion given hemoglobin 11 g percent and no active bleeding Transfuse if hemoglobin less than 7 g percent or if active bleeding  INFECTIOUS A:   No evidence of infection P:   Monitor  ENDOCRINE A:   Nil acute   P:   Monitor  NEUROLOGIC A:   Intact but at risk for alcohol withdrawal P:   RASS goal: 0 and -2 Precedex if needed Civa protocol if needed   FAMILY  - Updates: Patient and wife updated the bedside in the emergency department at admission 09/28/2015     The patient is critically ill with multiple organ systems failure and requires high complexity decision making for assessment and support, frequent evaluation and titration of therapies, application of advanced monitoring technologies and extensive interpretation of multiple databases.   Critical Care Time devoted to patient care services described in this note is  30  Minutes. This time reflects time of care of this signee Dr Brand Males. This  critical care time does not reflect procedure time, or teaching time or supervisory time of PA/NP/Med student/Med Resident etc but could involve care discussion time    Dr. Brand Males, M.D., [facility].C.P Pulmonary and Critical Care Medicine Staff Physician [facility] Pager: [phone], If no answer or between  15:00h - 7:00h: call [phone]  09/28/2015 7:53 AM

## 2015-09-28 NOTE — Brief Op Note (Signed)
8 mm duodenal bulb ulcer with adherent clot. Tiny gastric ulcer noted. S/P [id number] injection submucosally around both ulcers. See endopro note for details. Continue Protonix drip. D/C Octreotide drip. NPO except ice chips and sips for meds. Check H. Pylori serology and treat if positive. Follow H/Hs. D/W EGD findings with pt and wife.

## 2015-09-28 NOTE — ED Notes (Signed)
Bed: RESA Expected date:  Expected time:  Means of arrival:  Comments: [place] still in room

## 2015-09-28 NOTE — ED Provider Notes (Signed)
CSN: [id number]     Arrival date & time 09/28/15  0510 History   First MD Initiated Contact with Patient 09/28/15 [phone]     Chief Complaint  Patient presents with  . GI Bleeding     (Consider location/radiation/quality/duration/timing/severity/associated sxs/prior Treatment) Patient is a 49 y.o. [name] presenting with hematochezia. The history is provided by the patient.  Rectal Bleeding Quality:  Black and tarry Amount:  Moderate Timing:  Constant Progression:  Unchanged Chronicity:  New Context: spontaneously   Context: not anal fissures   Relieved by:  Nothing Worsened by:  Nothing tried Ineffective treatments:  None tried Associated symptoms: abdominal pain and vomiting   Associated symptoms: no fever   Abdominal pain:    Location:  Epigastric   Quality:  Burning   Severity:  Moderate   Onset quality:  Sudden   Timing:  Constant   Progression:  Unchanged Vomiting:    Emesis appearance: dark. Risk factors: no anticoagulant use and no NSAID use     Past Medical History  Diagnosis Date  . Hypertension 1984  . Depression   . PTSD (post-traumatic stress disorder)     Nature conservation officer; retired Corporate treasurer  . Arthritis   . Gout    Past Surgical History  Procedure Laterality Date  . Knee arthroscopy  [id number]    bilateral  . Ankle arthroscopy  2005.2006    bilateral  . Bunionectomy  1994    rt   foot  . Tonsillectomy    . Lumbar laminectomy/decompression microdiscectomy  09/29/2012    Procedure: LUMBAR LAMINECTOMY/DECOMPRESSION MICRODISCECTOMY 2 LEVELS;  Surgeon: Kristeen Miss, MD;  Location: [facility];  Service: Neurosurgery;  Laterality: Left;  Left Lumbar four-five, Lumbar five-Sacral one Microdiskectomy   History reviewed. No pertinent family history. Social History  Substance Use Topics  . Smoking status: Never Smoker   . Smokeless tobacco: Never Used  . Alcohol Use: 3.6 oz/week    6 Cans of beer per week     Comment: socially    Review of Systems   Constitutional: Negative for fever.  Gastrointestinal: Positive for vomiting, abdominal pain, hematochezia and anal bleeding.  All other systems reviewed and are negative.     Allergies  Review of patient's allergies indicates no known allergies.  Home Medications   Prior to Admission medications   Medication Sig Start Date End Date Taking? Authorizing Provider  allopurinol (ZYLOPRIM) 300 MG tablet Take 300 mg by mouth daily.   Yes Historical Provider, MD  cholecalciferol (VITAMIN D) 1000 UNITS tablet Take 1,000 Units by mouth daily.   Yes Historical Provider, MD  lisinopril (PRINIVIL,ZESTRIL) 20 MG tablet Take 20 mg by mouth daily.   Yes Historical Provider, MD   BP 117/68 mmHg  Pulse 97  Temp(Src) Jared.2 F (36.8 C) (Oral)  Resp 20  Ht 5\' 11"  (1.803 m)  Wt 172 lb (78.019 kg)  BMI 24.00 kg/m2  SpO2 Jared% Physical Exam  Constitutional: He is oriented to person, place, and time. He appears well-developed and well-nourished. No distress.  HENT:  Head: Normocephalic and atraumatic.  Mouth/Throat: Oropharynx is clear and moist.  Eyes: EOM are normal. Pupils are equal, round, and reactive to light.  Neck: Normal range of motion. Neck supple.  Cardiovascular: Normal rate, regular rhythm and intact distal pulses.   Pulmonary/Chest: Effort normal and breath sounds normal. No respiratory distress. He has no wheezes. He has no rales.  Abdominal: Soft. Bowel sounds are increased. There is tenderness. There is no rebound, no tenderness  at McBurney's point and negative Murphy's sign.  Genitourinary: Guaiac positive stool.  Musculoskeletal: Normal range of motion.  Neurological: He is alert and oriented to person, place, and time.  Skin: Skin is warm and dry.  Psychiatric: He has a normal mood and affect.    ED Course  Procedures (including critical care time) Labs Review Labs Reviewed  POC OCCULT BLOOD, ED - Abnormal; Notable for the following:    Fecal Occult Bld POSITIVE (*)     All other components within normal limits  CBC WITH DIFFERENTIAL/PLATELET  HEPATIC FUNCTION PANEL  I-STAT CHEM 8, ED  TYPE AND SCREEN    Imaging Review No results found. I have personally reviewed and evaluated these images and lab results as part of my medical decision-making.   EKG Interpretation None      MDM   Final diagnoses:  None    Results for orders placed or performed during the hospital encounter of 09/28/15  CBC with Differential/Platelet  Result Value Ref Range   WBC 9.9 4.0 - 10.5 K/uL   RBC 3.58 (L) 4.22 - 5.81 MIL/uL   Hemoglobin 11.2 (L) 13.0 - 17.0 g/dL   HCT 33.6 (L) 39.0 - 52.0 %   MCV 93.9 78.0 - 100.0 fL   MCH 31.3 26.0 - 34.0 pg   MCHC 33.3 30.0 - 36.0 g/dL   RDW 13.5 11.5 - 15.5 %   Platelets 282 150 - 400 K/uL   Neutrophils Relative % 64 %   Neutro Abs 6.3 1.7 - 7.7 K/uL   Lymphocytes Relative 26 %   Lymphs Abs 2.6 0.7 - 4.0 K/uL   Monocytes Relative 7 %   Monocytes Absolute 0.7 0.1 - 1.0 K/uL   Eosinophils Relative 3 %   Eosinophils Absolute 0.3 0.0 - 0.7 K/uL   Basophils Relative 0 %   Basophils Absolute 0.0 0.0 - 0.1 K/uL  I-Stat Chem 8, ED  Result Value Ref Range   Sodium 137 135 - 145 mmol/L   Potassium 3.7 3.5 - 5.1 mmol/L   Chloride 100 (L) 101 - 111 mmol/L   BUN 34 (H) 6 - 20 mg/dL   Creatinine, Ser 0.80 0.61 - 1.24 mg/dL   Glucose, Bld 163 (H) 65 - 99 mg/dL   Calcium, Ion 1.18 1.12 - 1.23 mmol/L   TCO2 23 0 - 100 mmol/L   Hemoglobin 12.2 (L) 13.0 - 17.0 g/dL   HCT 36.0 (L) 39.0 - 52.0 %  POC occult blood, ED Provider will collect  Result Value Ref Range   Fecal Occult Bld POSITIVE (A) NEGATIVE  Type and screen  Result Value Ref Range   ABO/RH(D) A NEG    Antibody Screen PENDING    Sample Expiration 10/01/2015    Dg Abd Acute W/chest  09/28/2015  CLINICAL DATA:  Generalized abdominal burning for 2 weeks. Nausea and vomiting. Initial encounter. EXAM: DG ABDOMEN ACUTE W/ 1V CHEST COMPARISON:  Chest radiograph performed  09/24/2012 FINDINGS: The lungs are well-aerated and clear. There is no evidence of focal opacification, pleural effusion or pneumothorax. The cardiomediastinal silhouette is within normal limits. The visualized bowel gas pattern is unremarkable. Scattered stool and air are seen within the colon; there is no evidence of small bowel dilatation to suggest obstruction. No free intra-abdominal air is identified on the provided upright view. No acute osseous abnormalities are seen; the sacroiliac joints are unremarkable in appearance. IMPRESSION: 1. Unremarkable bowel gas pattern; no free intra-abdominal air seen. Small amount of stool noted in  the colon. 2. No acute cardiopulmonary process seen. Electronically Signed   By: Garald Balding M.D.   On: 09/28/2015 06:53    Medications  pantoprazole (PROTONIX) 80 mg in sodium chloride 0.9 % 250 mL (0.32 mg/mL) infusion (8 mg/hr Intravenous New Bag/Given 09/28/15 0647)  pantoprazole (PROTONIX) injection 40 mg (not administered)  sodium chloride 0.9 % bolus 2,000 mL (not administered)  octreotide (SANDOSTATIN) 2 mcg/mL load via infusion 50 mcg (not administered)    And  octreotide (SANDOSTATIN) 500 mcg in sodium chloride 0.9 % 250 mL (2 mcg/mL) infusion (not administered)  sodium chloride 0.9 % bolus 2,000 mL (2,000 mLs Intravenous New Bag/Given 09/28/15 0609)  ondansetron (ZOFRAN) injection 4 mg (4 mg Intravenous Given 09/28/15 [id number])  pantoprazole (PROTONIX) 80 mg in sodium chloride 0.9 % 100 mL IVPB (0 mg Intravenous Stopped 09/28/15 [id number])   Case d/w Dr. Michail Sermon will scope in the ICU  Case d/w Dr. Einar Gip will send Morning doc  Case d/w Dr. Chase Caller who will see the patient  MDM Reviewed: previous chart, nursing note and vitals Interpretation: labs and x-ray (elevated BUN to creatinine ratio elevated anion gap, stool on xr by me) Total time providing critical care: 30-74 minutes. This excludes time spent performing separately reportable procedures and  services. Consults: critical care and gastrointestinal  CRITICAL CARE Performed by: Carlisle Beers Total critical care time: 60 minutes Critical care time was exclusive of separately billable procedures and treating other patients. Critical care was necessary to treat or prevent imminent or life-threatening deterioration. Critical care was time spent personally by me on the following activities: development of treatment plan with patient and/or surrogate as well as nursing, discussions with consultants, evaluation of patient's response to treatment, examination of patient, obtaining history from patient or surrogate, ordering and performing treatments and interventions, ordering and review of laboratory studies, ordering and review of radiographic studies, pulse oximetry and re-evaluation of patient's condition.      Veatrice Kells, MD 09/28/15 [phone]

## 2015-09-29 ENCOUNTER — Encounter (HOSPITAL_COMMUNITY): Payer: Self-pay | Admitting: Gastroenterology

## 2015-09-29 DIAGNOSIS — E876 Hypokalemia: Secondary | ICD-10-CM | POA: Diagnosis present

## 2015-09-29 DIAGNOSIS — D62 Acute posthemorrhagic anemia: Secondary | ICD-10-CM

## 2015-09-29 DIAGNOSIS — F1010 Alcohol abuse, uncomplicated: Secondary | ICD-10-CM

## 2015-09-29 DIAGNOSIS — K264 Chronic or unspecified duodenal ulcer with hemorrhage: Principal | ICD-10-CM

## 2015-09-29 LAB — CBC
HCT: 24.2 % — ABNORMAL LOW (ref 39.0–52.0)
HCT: 24 % — ABNORMAL LOW (ref 39.0–52.0)
HEMOGLOBIN: 8.3 g/dL — AB (ref 13.0–17.0)
Hemoglobin: 8.1 g/dL — ABNORMAL LOW (ref 13.0–17.0)
MCH: 32.3 pg (ref 26.0–34.0)
MCH: 31.4 pg (ref 26.0–34.0)
MCHC: 34.3 g/dL (ref 30.0–36.0)
MCHC: 33.8 g/dL (ref 30.0–36.0)
MCV: 94.2 fL (ref 78.0–100.0)
MCV: 93 fL (ref 78.0–100.0)
PLATELETS: 188 10*3/uL (ref 150–400)
Platelets: 193 10*3/uL (ref 150–400)
RBC: 2.57 MIL/uL — ABNORMAL LOW (ref 4.22–5.81)
RBC: 2.58 MIL/uL — AB (ref 4.22–5.81)
RDW: 13.5 % (ref 11.5–15.5)
RDW: 13.4 % (ref 11.5–15.5)
WBC: 6 10*3/uL (ref 4.0–10.5)
WBC: 5.4 10*3/uL (ref 4.0–10.5)

## 2015-09-29 LAB — BASIC METABOLIC PANEL
Anion gap: 4 — ABNORMAL LOW (ref 5–15)
BUN: 12 mg/dL (ref 6–20)
CHLORIDE: 109 mmol/L (ref 101–111)
CO2: 27 mmol/L (ref 22–32)
CREATININE: 0.77 mg/dL (ref 0.61–1.24)
Calcium: 7.9 mg/dL — ABNORMAL LOW (ref 8.9–10.3)
GFR calc Af Amer: >60 mL/min (ref >60)
GFR calc non Af Amer: >60 mL/min (ref >60)
GLUCOSE: 106 mg/dL — AB (ref 65–99)
Potassium: 3.4 mmol/L — ABNORMAL LOW (ref 3.5–5.1)
SODIUM: 140 mmol/L (ref 135–145)

## 2015-09-29 LAB — PHOSPHORUS: Phosphorus: 1.9 mg/dL — ABNORMAL LOW (ref 2.5–4.6)

## 2015-09-29 LAB — DIFFERENTIAL
BASOS PCT: 0 %
Basophils Absolute: 0 10*3/uL (ref 0.0–0.1)
EOS ABS: 0.1 10*3/uL (ref 0.0–0.7)
EOS PCT: 1 %
LYMPHS PCT: 34 %
Lymphs Abs: 2.1 10*3/uL (ref 0.7–4.0)
MONO ABS: 0.7 10*3/uL (ref 0.1–1.0)
Monocytes Relative: 11 %
Neutro Abs: 3.2 10*3/uL (ref 1.7–7.7)
Neutrophils Relative %: 54 %

## 2015-09-29 LAB — LACTIC ACID, PLASMA: Lactic Acid, Venous: 1.1 mmol/L (ref 0.5–2.0)

## 2015-09-29 LAB — MAGNESIUM: MAGNESIUM: 1.9 mg/dL (ref 1.7–2.4)

## 2015-09-29 MED ORDER — DEXTROSE 5 % IV SOLN
30.0000 mmol | Freq: Once | INTRAVENOUS | Status: AC
  Administered 2015-09-29: 30 mmol via INTRAVENOUS
  Filled 2015-09-29: qty 10

## 2015-09-29 MED ORDER — MAGNESIUM SULFATE 2 GM/50ML IV SOLN
2.0000 g | Freq: Once | INTRAVENOUS | Status: AC
  Administered 2015-09-29: 2 g via INTRAVENOUS
  Filled 2015-09-29: qty 50

## 2015-09-29 MED ORDER — POTASSIUM CHLORIDE CRYS ER 20 MEQ PO TBCR
30.0000 meq | EXTENDED_RELEASE_TABLET | Freq: Once | ORAL | Status: AC
  Administered 2015-09-29: 30 meq via ORAL
  Filled 2015-09-29: qty 1

## 2015-09-29 NOTE — Progress Notes (Signed)
[facility] PROGRESS NOTE   Chrstopher Dowtin [id number] DOB: 1966-06-28 DOA: 09/28/2015 PCP: Shellia Cleverly, DO  HPI/Subjective: Denies any complaint, 1 black stools since yesterday, reportedly clearing up.  Assessment/Plan: Principal Problem:   GI bleed Active Problems:   Acute blood loss anemia   Alcohol abuse   Hypokalemia    Upper GI bleed Patient presented to the hospital with one-time hematemesis and melena. Admitted initially by PCCM to the ICU because of transient hypotension. Seen by Dr. Michail Sermon, EGD was done and showed duodenal ulcer, gastric antral ulcer and erosive esophagitis/gastritis No evidence of recurrent bleed. Started on clear liquids, advance as tolerated.  Acute blood loss anemia Presented with hemoglobin of 11.2, after aggressive IV fluid hydration hemoglobin dropped to 9.7. Today is 8.1, hemoglobin continues to drop slowly but consistently. We'll transfuse if hemoglobin less than 7.5. Check hemoglobin in the morning.  Hypokalemia Likely secondary to IV fluid hydration without supplementation, replete with oral supplements.  Alcohol abuse Drinks beer, about 6 cans, no history of withdrawal monitor closely.   Code Status: Full Code Family Communication: Plan discussed with the patient. Disposition Plan: Remains inpatient Diet: Diet clear liquid Room service appropriate?: Yes; Fluid consistency:: Thin  Consultants:  GI  Procedures:  None  Antibiotics:  None   Objective: Filed Vitals:   09/29/15 0800 09/29/15 0900  BP: 129/118 127/84  Pulse: 76 73  Temp: 98.9 F (37.2 C)   Resp: 12 18    Intake/Output Summary (Last 24 hours) at 09/29/15 1203 Last data filed at 09/29/15 0900  Gross per 24 hour  Intake   3235 ml  Output   3775 ml  Net   -540 ml   Filed Weights   09/28/15 0519 09/29/15 0507  Weight: 78.019 kg (172 lb) 74.5 kg (164 lb 3.9 oz)    Exam: General: Alert and awake, oriented x3, not in any  acute distress. HEENT: anicteric sclera, pupils reactive to light and accommodation, EOMI CVS: S1-S2 clear, no murmur rubs or gallops Chest: clear to auscultation bilaterally, no wheezing, rales or rhonchi Abdomen: soft nontender, nondistended, normal bowel sounds, no organomegaly Extremities: no cyanosis, clubbing or edema noted bilaterally Neuro: Cranial nerves II-XII intact, no focal neurological deficits  Data Reviewed: Basic Metabolic Panel:  Recent Labs Lab 09/28/15 0619 09/28/15 0851 09/29/15 0155  NA 137 134* 140  K 3.7 4.5 3.4*  CL 100* 108 109  CO2  --  19* 27  GLUCOSE 163* 187* 106*  BUN 34* 31* 12  CREATININE 0.80 0.67 0.77  CALCIUM  --  7.4* 7.9*  MG  --  1.5* 1.9  PHOS  --  2.0* 1.9*   Liver Function Tests:  Recent Labs Lab 09/28/15 0600 09/28/15 0646 09/28/15 0851  AST 28 22 22   ALT 50 40 42  ALKPHOS 62 52 55  BILITOT 1.3* 0.9 0.8  PROT 5.8* 4.5* 5.3*  ALBUMIN 3.2* 2.5* 3.0*    Recent Labs Lab 09/28/15 0851  LIPASE 24  AMYLASE 88   No results for input(s): AMMONIA in the last 168 hours. CBC:  Recent Labs Lab 09/28/15 0600  09/28/15 0851 09/28/15 1508 09/28/15 2040 09/29/15 0155 09/29/15 0820  WBC 9.9  --  16.3* 12.4* 7.9 6.0 5.4  NEUTROABS 6.3  --   --   --   --  3.2  --   HGB 11.2*  < > 9.7* 9.5* 8.3* 8.3* 8.1*  HCT 33.6*  < > 28.6* 27.8* 24.6* 24.2* 24.0*  MCV 93.9  --  95.0 93.0 93.2 94.2 93.0  PLT 282  --  284 263 219 193 188  < > = values in this interval not displayed. Cardiac Enzymes:  Recent Labs Lab 09/28/15 0851 09/28/15 1508 09/28/15 2040  TROPONINI <0.03 <0.03 <0.03   BNP (last 3 results) No results for input(s): BNP in the last 8760 hours.  ProBNP (last 3 results) No results for input(s): PROBNP in the last 8760 hours.  CBG: No results for input(s): GLUCAP in the last 168 hours.  Micro Recent Results (from the past 240 hour(s))  MRSA PCR Screening     Status: Abnormal   Collection Time: 09/28/15  6:33  PM  Result Value Ref Range Status   MRSA by PCR POSITIVE (A) NEGATIVE Final    Comment:        The GeneXpert MRSA Assay (FDA approved for NASAL specimens only), is one component of a comprehensive MRSA colonization surveillance program. It is not intended to diagnose MRSA infection nor to guide or monitor treatment for MRSA infections. RESULT CALLED TO, READ BACK BY AND VERIFIED WITHLoletha Grayer [facility] RN [id number] 09/28/15 A NAVARRO      Studies: Dg Chest Port 1 View  09/28/2015  CLINICAL DATA:  Acute upper GI bleed. EXAM: PORTABLE CHEST 1 VIEW 8:07 a.m. COMPARISON:  09/28/2015 at 6:26 a.m. and 09/24/2012 FINDINGS: There is slight diffuse interstitial accentuation which is new since the prior exam, probably representing slight pulmonary edema. The heart size and pulmonary vascularity are normal. No infiltrates or effusions. No osseous abnormality. IMPRESSION: Subtle slight interstitial edema. Electronically Signed   By: Lorriane Shire M.D.   On: 09/28/2015 08:25   Dg Abd Acute W/chest  09/28/2015  CLINICAL DATA:  Generalized abdominal burning for 2 weeks. Nausea and vomiting. Initial encounter. EXAM: DG ABDOMEN ACUTE W/ 1V CHEST COMPARISON:  Chest radiograph performed 09/24/2012 FINDINGS: The lungs are well-aerated and clear. There is no evidence of focal opacification, pleural effusion or pneumothorax. The cardiomediastinal silhouette is within normal limits. The visualized bowel gas pattern is unremarkable. Scattered stool and air are seen within the colon; there is no evidence of small bowel dilatation to suggest obstruction. No free intra-abdominal air is identified on the provided upright view. No acute osseous abnormalities are seen; the sacroiliac joints are unremarkable in appearance. IMPRESSION: 1. Unremarkable bowel gas pattern; no free intra-abdominal air seen. Small amount of stool noted in the colon. 2. No acute cardiopulmonary process seen. Electronically Signed   By: Garald Balding M.D.    On: 09/28/2015 06:53    Scheduled Meds: . Chlorhexidine Gluconate Cloth  6 each Topical Q0600  . folic acid  1 mg Oral Daily   Or  . folic acid  1 mg Intravenous Daily  . multivitamin with minerals  1 tablet Oral Daily  . mupirocin ointment  1 application Nasal BID  . [START ON 10/01/2015] pantoprazole (PROTONIX) IV  40 mg Intravenous Q12H  . thiamine  100 mg Oral Daily   Or  . thiamine IV  100 mg Intravenous Daily   Continuous Infusions: . sodium chloride 100 mL/hr at 09/28/15 0946  . pantoprozole (PROTONIX) infusion 8 mg/hr (09/29/15 0408)       Time spent: 35 minutes    [facility] A  [facility] Pager [phone] If 7PM-7AM, please contact night-coverage at [email or web address], password [facility] 09/29/2015, 12:03 PM  LOS: 1 day

## 2015-09-29 NOTE — Care Management Note (Signed)
Case Management Note  Patient Details  Name: Jared [name] MRN: [id number] Date of Birth: Aug 13, 1966  Subjective/Objective:        Variceal bleed emergent egd and iv meds            Action/Plan:Date: September 29, 2015 Chart reviewed for concurrent status and case management needs. Will continue to follow patient for changes and needs: Velva Harman, RN, BSN, [place]   [phone]   Expected Discharge Date:                  Expected Discharge Plan:  Home/Self Care  In-House Referral:  Clinical Social Work  Discharge planning Services  CM Consult  Post Acute Care Choice:  NA Choice offered to:  NA  DME Arranged:    DME Agency:     HH Arranged:    [place] Agency:     Status of Service:  In process, will continue to follow  Medicare Important Message Given:    Date Medicare IM Given:    Medicare IM give by:    Date Additional Medicare IM Given:    Additional Medicare Important Message give by:     If discussed at [place] of Stay Meetings, dates discussed:    Additional Comments:  Leeroy Cha, RN 09/29/2015, 10:11 AM

## 2015-09-29 NOTE — Progress Notes (Signed)
[facility] Progress Note Patient Name: Yoshi Leeb DOB: 08-05-1966 MRN: [id number]   Date of Service  09/29/2015  HPI/Events of Note  Hypokalemia and hypophosphatemia  eICU Interventions  Potassium and phos replaced     Intervention Category Intermediate Interventions: Electrolyte abnormality - evaluation and management  DETERDING,ELIZABETH 09/29/2015, 3:12 AM

## 2015-09-29 NOTE — Progress Notes (Signed)
Patient ID: Jared [name], male   DOB: [birth year], 49 y.o.   MRN: [id number] [facility] Progress Note  Jared [name] 50 y.o. Dec 31, 1965   Subjective: Reports one black stool overnight that his wife saw. Reports mild epigastric pain.   Objective: Vital signs in last 24 hours: Filed Vitals:   09/29/15 0800 09/29/15 0900  BP: 129/118 127/84  Pulse: 76 73  Temp: 98.9 F (37.2 C)   Resp: 12 18    Physical Exam: Gen: alert, no acute distress, well-nourished HEENT: anicteric sclera CV: RRR Chest: CTA B Abd: soft, nontender, nondistended, +BS  Lab Results:  Recent Labs  09/28/15 0851 09/29/15 0155  NA 134* 140  K 4.5 3.4*  CL 108 109  CO2 19* 27  GLUCOSE 187* 106*  BUN 31* 12  CREATININE 0.67 0.77  CALCIUM 7.4* 7.9*  MG 1.5* 1.9  PHOS 2.0* 1.9*    Recent Labs  09/28/15 0646 09/28/15 0851  AST 22 22  ALT 40 42  ALKPHOS 52 55  BILITOT 0.9 0.8  PROT 4.5* 5.3*  ALBUMIN 2.5* 3.0*    Recent Labs  09/28/15 0600  09/29/15 0155 09/29/15 0820  WBC 9.9  < > 6.0 5.4  NEUTROABS 6.3  --  3.2  --   HGB 11.2*  < > 8.3* 8.1*  HCT 33.6*  < > 24.2* 24.0*  MCV 93.9  < > 94.2 93.0  PLT 282  < > 193 188  < > = values in this interval not displayed.  Recent Labs  09/28/15 0646 09/28/15 0851  LABPROT 15.3* 15.2  INR 1.20 1.19      Assessment/Plan: S/P Duodenal ulcer bleed - black stool last night likely residual blood. Hgb 8.1 but doubt recurrent ulcer bleed but if Hgb continues to fall then will need blood transfusion. H. Pylori serology pending. Continue Protonix drip. Start clear liquid diet and advance as tolerated. Dr. Penelope Coop to f/u tomorrow.    [place] C. 09/29/2015, 11:09 AM  Pager [phone]  If no answer or after 5 PM call [phone]

## 2015-09-29 NOTE — Progress Notes (Signed)
PEr RN bp/hr stable and no visible bleeing GI seeing patient  Recent Labs Lab 09/28/15 0851 09/28/15 1508 09/28/15 2040 09/29/15 0155 09/29/15 0820  HGB 9.7* 9.5* 8.3* 8.3* 8.1*    PCCM will sign off  ,Dr. Brand Males, M.D., [facility].C.P Pulmonary and Critical Care Medicine Staff Physician [facility] Pager: [phone], If no answer or between  15:00h - 7:00h: call [phone]  09/29/2015 11:02 AM

## 2015-09-30 LAB — CBC
HCT: 23.6 % — ABNORMAL LOW (ref 39.0–52.0)
HEMOGLOBIN: 8 g/dL — AB (ref 13.0–17.0)
MCH: 32.1 pg (ref 26.0–34.0)
MCHC: 33.9 g/dL (ref 30.0–36.0)
MCV: 94.8 fL (ref 78.0–100.0)
PLATELETS: 190 10*3/uL (ref 150–400)
RBC: 2.49 MIL/uL — AB (ref 4.22–5.81)
RDW: 13.3 % (ref 11.5–15.5)
WBC: 3.9 10*3/uL — AB (ref 4.0–10.5)

## 2015-09-30 LAB — BASIC METABOLIC PANEL
ANION GAP: 4 — AB (ref 5–15)
BUN: 6 mg/dL (ref 6–20)
CHLORIDE: 108 mmol/L (ref 101–111)
CO2: 28 mmol/L (ref 22–32)
Calcium: 8.2 mg/dL — ABNORMAL LOW (ref 8.9–10.3)
Creatinine, Ser: 0.78 mg/dL (ref 0.61–1.24)
GFR calc Af Amer: >60 mL/min (ref >60)
GFR calc non Af Amer: >60 mL/min (ref >60)
GLUCOSE: 104 mg/dL — AB (ref 65–99)
POTASSIUM: 3.6 mmol/L (ref 3.5–5.1)
SODIUM: 140 mmol/L (ref 135–145)

## 2015-09-30 LAB — MAGNESIUM: MAGNESIUM: 2.2 mg/dL (ref 1.7–2.4)

## 2015-09-30 LAB — H. PYLORI ANTIBODY, IGG: H Pylori IgG: 6.8 U/mL — ABNORMAL HIGH (ref 0.0–0.8)

## 2015-09-30 LAB — PHOSPHORUS: Phosphorus: 2.4 mg/dL — ABNORMAL LOW (ref 2.5–4.6)

## 2015-09-30 NOTE — Progress Notes (Signed)
[facility] Progress Note  Subjective: The patient feels well today. He is eating. He has not had any signs of any active bleeding. His hemoglobin is stable.  Objective: Vital signs in last 24 hours: Temp:  [98.4 F (36.9 C)] 98.4 F (36.9 C) (11/26 0644) Pulse Rate:  [69-162] 89 (11/26 0644) Resp:  [10-17] 17 (11/26 0644) BP: (127-146)/(75-93) 127/77 mmHg (11/26 0644) SpO2:  [95 %-100 %] 100 % (11/26 0644) Weight:  [71.26 kg (157 lb 1.6 oz)] 71.26 kg (157 lb 1.6 oz) (11/26 0644) Weight change: -3.24 kg (-7 lb 2.3 oz)   PE:  No distress or graft heart regular rhythm no murmurs  Lungs clear  Abdomen soft and nontender  Lab Results: Results for orders placed or performed during the hospital encounter of 09/28/15 (from the past 24 hour(s))  Basic metabolic panel     Status: Abnormal   Collection Time: 09/30/15  4:08 AM  Result Value Ref Range   Sodium 140 135 - 145 mmol/L   Potassium 3.6 3.5 - 5.1 mmol/L   Chloride 108 101 - 111 mmol/L   CO2 28 22 - 32 mmol/L   Glucose, Bld 104 (H) 65 - 99 mg/dL   BUN 6 6 - 20 mg/dL   Creatinine, Ser 0.78 0.61 - 1.24 mg/dL   Calcium 8.2 (L) 8.9 - 10.3 mg/dL   GFR calc non Af Amer >60 >60 mL/min   GFR calc Af Amer >60 >60 mL/min   Anion gap 4 (L) 5 - 15  Magnesium     Status: None   Collection Time: 09/30/15  4:08 AM  Result Value Ref Range   Magnesium 2.2 1.7 - 2.4 mg/dL  Phosphorus     Status: Abnormal   Collection Time: 09/30/15  4:08 AM  Result Value Ref Range   Phosphorus 2.4 (L) 2.5 - 4.6 mg/dL  CBC     Status: Abnormal   Collection Time: 09/30/15  4:08 AM  Result Value Ref Range   WBC 3.9 (L) 4.0 - 10.5 K/uL   RBC 2.49 (L) 4.22 - 5.81 MIL/uL   Hemoglobin 8.0 (L) 13.0 - 17.0 g/dL   HCT 23.6 (L) 39.0 - 52.0 %   MCV 94.8 78.0 - 100.0 fL   MCH 32.1 26.0 - 34.0 pg   MCHC 33.9 30.0 - 36.0 g/dL   RDW 13.3 11.5 - 15.5 %   Platelets 190 150 - 400 K/uL    Studies/Results: No results found.    Assessment: GI  bleed  Duodenal ulcer    Plan:   Continue PPI therapy. If he is stable I think he can go home tomorrow.    Cassell Clement 09/30/2015, 12:22 PM  Pager: [phone] If no answer or after 5 PM call [phone]

## 2015-09-30 NOTE — Progress Notes (Signed)
[facility] PROGRESS NOTE   Jared [name] [id number] DOB: 12-09-1965 DOA: 09/28/2015 PCP: Shellia Cleverly, DO  HPI/Subjective: 1 BM yesterday still dark colored, regular food today. If hemoglobin is stable overnight can probably discharge home.  Assessment/Plan: Principal Problem:   GI bleed Active Problems:   Acute blood loss anemia   Alcohol abuse   Hypokalemia    Upper GI bleed Patient presented to the hospital with one-time hematemesis and melena. Admitted initially by PCCM to the ICU because of transient hypotension. Seen by Dr. Michail Sermon, EGD was done and showed duodenal ulcer, gastric antral ulcer and erosive esophagitis/gastritis No evidence of recurrent bleed. Patient takes 6-8 tablets of Advil daily for back pain, probably causing his upper GI bleed.  Acute blood loss anemia Presented with hemoglobin of 11.2, after aggressive IV fluid hydration hemoglobin dropped to 9.7. Today is 8.1, hemoglobin continues to drop slowly but consistently. We'll transfuse if hemoglobin less than 7.5. Check hemoglobin in the morning.  Hypokalemia Likely secondary to IV fluid hydration without supplementation, replete with oral supplements.  Alcohol abuse Drinks beer, about 6 cans per day, counseled, no history of withdrawal monitor closely.   Code Status: Full Code Family Communication: Plan discussed with the patient. Disposition Plan: Remains inpatient Diet: Diet regular Room service appropriate?: Yes; Fluid consistency:: Thin  Consultants:  GI  Procedures:  None  Antibiotics:  None   Objective: Filed Vitals:   09/29/15 1500 09/30/15 0644  BP: 129/75 127/77  Pulse: 75 89  Temp:  98.4 F (36.9 C)  Resp: 10 17    Intake/Output Summary (Last 24 hours) at 09/30/15 1234 Last data filed at 09/30/15 [id number]  Gross per 24 hour  Intake  277.5 ml  Output   2200 ml  Net -1922.5 ml   Filed Weights   09/28/15 0519 09/29/15 0507 09/30/15 0644    Weight: 78.019 kg (172 lb) 74.5 kg (164 lb 3.9 oz) 71.26 kg (157 lb 1.6 oz)    Exam: General: Alert and awake, oriented x3, not in any acute distress. HEENT: anicteric sclera, pupils reactive to light and accommodation, EOMI CVS: S1-S2 clear, no murmur rubs or gallops Chest: clear to auscultation bilaterally, no wheezing, rales or rhonchi Abdomen: soft nontender, nondistended, normal bowel sounds, no organomegaly Extremities: no cyanosis, clubbing or edema noted bilaterally Neuro: Cranial nerves II-XII intact, no focal neurological deficits  Data Reviewed: Basic Metabolic Panel:  Recent Labs Lab 09/28/15 0619 09/28/15 0851 09/29/15 0155 09/30/15 0408  NA 137 134* 140 140  K 3.7 4.5 3.4* 3.6  CL 100* 108 109 108  CO2  --  19* 27 28  GLUCOSE 163* 187* 106* 104*  BUN 34* 31* 12 6  CREATININE 0.80 0.67 0.77 0.78  CALCIUM  --  7.4* 7.9* 8.2*  MG  --  1.5* 1.9 2.2  PHOS  --  2.0* 1.9* 2.4*   Liver Function Tests:  Recent Labs Lab 09/28/15 0600 09/28/15 0646 09/28/15 0851  AST 28 22 22   ALT 50 40 42  ALKPHOS 62 52 55  BILITOT 1.3* 0.9 0.8  PROT 5.8* 4.5* 5.3*  ALBUMIN 3.2* 2.5* 3.0*    Recent Labs Lab 09/28/15 0851  LIPASE 24  AMYLASE 88   No results for input(s): AMMONIA in the last 168 hours. CBC:  Recent Labs Lab 09/28/15 0600  09/28/15 1508 09/28/15 2040 09/29/15 0155 09/29/15 0820 09/30/15 0408  WBC 9.9  < > 12.4* 7.9 6.0 5.4 3.9*  NEUTROABS 6.3  --   --   --  3.2  --   --   HGB 11.2*  < > 9.5* 8.3* 8.3* 8.1* 8.0*  HCT 33.6*  < > 27.8* 24.6* 24.2* 24.0* 23.6*  MCV 93.9  < > 93.0 93.2 94.2 93.0 94.8  PLT 282  < > 263 219 193 188 190  < > = values in this interval not displayed. Cardiac Enzymes:  Recent Labs Lab 09/28/15 0851 09/28/15 1508 09/28/15 2040  TROPONINI <0.03 <0.03 <0.03   BNP (last 3 results) No results for input(s): BNP in the last 8760 hours.  ProBNP (last 3 results) No results for input(s): PROBNP in the last 8760  hours.  CBG: No results for input(s): GLUCAP in the last 168 hours.  Micro Recent Results (from the past 240 hour(s))  MRSA PCR Screening     Status: Abnormal   Collection Time: 09/28/15  6:33 PM  Result Value Ref Range Status   MRSA by PCR POSITIVE (A) NEGATIVE Final    Comment:        The GeneXpert MRSA Assay (FDA approved for NASAL specimens only), is one component of a comprehensive MRSA colonization surveillance program. It is not intended to diagnose MRSA infection nor to guide or monitor treatment for MRSA infections. RESULT CALLED TO, READ BACK BY AND VERIFIED WITHLoletha Grayer [facility] RN 2134 09/28/15 A NAVARRO      Studies: No results found.  Scheduled Meds: . Chlorhexidine Gluconate Cloth  6 each Topical Q0600  . folic acid  1 mg Oral Daily   Or  . folic acid  1 mg Intravenous Daily  . multivitamin with minerals  1 tablet Oral Daily  . mupirocin ointment  1 application Nasal BID  . [START ON 10/01/2015] pantoprazole (PROTONIX) IV  40 mg Intravenous Q12H  . thiamine  100 mg Oral Daily   Or  . thiamine IV  100 mg Intravenous Daily   Continuous Infusions: . sodium chloride 75 mL/hr at 09/30/15 1047  . pantoprozole (PROTONIX) infusion 8 mg/hr (09/30/15 0307)       Time spent: 35 minutes    [facility] A  [facility] Pager [phone] If 7PM-7AM, please contact night-coverage at [email or web address], password [facility] 09/30/2015, 12:34 PM  LOS: 2 days

## 2015-10-01 LAB — BASIC METABOLIC PANEL
ANION GAP: 3 — AB (ref 5–15)
BUN: 7 mg/dL (ref 6–20)
CO2: 28 mmol/L (ref 22–32)
Calcium: 8 mg/dL — ABNORMAL LOW (ref 8.9–10.3)
Chloride: 103 mmol/L (ref 101–111)
Creatinine, Ser: 0.81 mg/dL (ref 0.61–1.24)
GFR calc Af Amer: >60 mL/min (ref >60)
GLUCOSE: 110 mg/dL — AB (ref 65–99)
POTASSIUM: 3.9 mmol/L (ref 3.5–5.1)
SODIUM: 134 mmol/L — AB (ref 135–145)

## 2015-10-01 LAB — PHOSPHORUS: PHOSPHORUS: 3.5 mg/dL (ref 2.5–4.6)

## 2015-10-01 LAB — CBC
HCT: 22.8 % — ABNORMAL LOW (ref 39.0–52.0)
Hemoglobin: 7.9 g/dL — ABNORMAL LOW (ref 13.0–17.0)
MCH: 32.8 pg (ref 26.0–34.0)
MCHC: 34.6 g/dL (ref 30.0–36.0)
MCV: 94.6 fL (ref 78.0–100.0)
PLATELETS: 189 10*3/uL (ref 150–400)
RBC: 2.41 MIL/uL — AB (ref 4.22–5.81)
RDW: 13.4 % (ref 11.5–15.5)
WBC: 6.2 10*3/uL (ref 4.0–10.5)

## 2015-10-01 LAB — MAGNESIUM: MAGNESIUM: 1.8 mg/dL (ref 1.7–2.4)

## 2015-10-01 MED ORDER — PANTOPRAZOLE SODIUM 40 MG PO TBEC: 40.0000 mg | DELAYED_RELEASE_TABLET | Freq: Every day | ORAL | Status: DC

## 2015-10-01 MED ORDER — FERROUS SULFATE 324 (65 FE) MG PO TBEC: 1.0000 | DELAYED_RELEASE_TABLET | Freq: Every day | ORAL | Status: AC

## 2015-10-01 NOTE — Progress Notes (Addendum)
Shift progressed uneventfully. Patient slept well overnight, without signs of acute distress.  Initial shift assessment was remarkable for relative lower extremity strength difference, L > R. There is evidence of pain-inhibited right lower extremity strength, attributed by patient as likely secondary to past history of right knee surgery. Patient utilizes a cane for support during ambulation.  CIWA protocol is currently being instituted. Patient demonstrated no signs or symptoms of alcohol withdrawal. Serial CIWA-AR scores remained consistently < 5, obviating need for PRN dosage of Lorazepam. Vital signs remained stable. Room air pulse oximetry readings were predominantly above > 92%.

## 2015-10-01 NOTE — Plan of Care (Signed)
Problem: Safety: Goal: Ability to remain free from injury will improve Outcome: Progressing Falls prevention protocol maintained. Reviewed with patient and reinforced the importance of adherence to safety measures to reduce likelihood for falls or fall-related injuries, such as appropriate use of the call bell,  maintaining the bed in low and locked position, keeping needed items within easy access and consistent wearing of non-skid footwear during ambulation attempts. Patient verbalized understanding and demonstrated compliance.         

## 2015-10-01 NOTE — Progress Notes (Signed)
Patient discharged home, all discharge medications and instructions reviewed and questions answered.  Patient declined wheelchair assistance to vehicle states will ambulate.  

## 2015-10-01 NOTE — Discharge Summary (Signed)
Physician Discharge Summary  Jared [name] [id number] DOB: [birth year] DOA: 09/28/2015  PCP: Shellia Cleverly, DO  Admit date: 09/28/2015 Discharge date: 10/01/2015  Time spent: 40 minutes  Recommendations for Outpatient Follow-up:  1. Follow-up with primary care physician in one week 2. Check CBC in 1-2 weeks.   Discharge Diagnoses:  Principal Problem:   GI bleed Active Problems:   Acute blood loss anemia   Alcohol abuse   Hypokalemia   Discharge Condition: Stable  Diet recommendation: Heart healthy  Filed Weights   09/28/15 0519 09/29/15 0507 09/30/15 0644  Weight: 78.019 kg (172 lb) 74.5 kg (164 lb 3.9 oz) 71.26 kg (157 lb 1.6 oz)    History of present illness:  49 year old male with significant alcohol intake but denies any antacid or nonsteroidal anti-inflammatory or aspirin or Plavix intake. Has had epigastric pain for the last few weeks. Last night started vomiting dark colored vomitus and this morning started having melena. Brought into the emergency department. According to the bedside nurse in the emergency department was transiently hypotensive but responded to fluids and since then has been doing well. Patient is currently on octreotide and proton pump inhibitor infusion and is hemodynamically stable. Worst hemoglobin was only 11 g percent. Baseline hemoglobin unknown. GI service is planning to endoscopy in the emergency department itself. Patient is not intubated and looks well. Critical care medicine being asked to admit the patient 09/28/2015  Hospital Course:   Upper GI bleed secondary to NSAIDs ulcers, gastritis and esophagitis Patient presented to the hospital with one-time hematemesis and melena. Admitted initially by PCCM to the ICU because of transient hypotension. Seen by Dr. Michail Sermon, EGD was done and showed duodenal ulcer, gastric antral ulcer and erosive esophagitis/gastritis. Initially started on Protonix drip which switched to Protonix  twice a day after the EGD. Patient takes 6-8 tablets of Advil daily for back pain, probably causing his upper GI bleed. Instructed to avoid NSAIDs, and discharged on Protonix 40 mg twice a day for 30 days then daily.  Acute blood loss anemia Presented with hemoglobin of 11.2, after aggressive IV fluid hydration hemoglobin dropped to 9.7. The hemoglobin drop plateau since the 24th, discharge hemoglobin is 7.94 yesterday and the day before yesterday was 8 and 8.1..  Hypokalemia Likely secondary to IV fluid hydration without supplementation, repleted with oral supplements.  Alcohol abuse Drinks beer, about 6 cans per day, counseled extensively in the presence of his wife.   Procedures:  EGD showed duodenal ulcer, gastric antral ulcer and erosive esophagitis/gastritis  Consultations:  Gastroenterology  Discharge Exam: Filed Vitals:   09/30/15 2108 10/01/15 0525  BP: 131/80 120/78  Pulse: 87 76  Temp: 98.5 F (36.9 C) 98.5 F (36.9 C)  Resp: 16 16   General: Alert and awake, oriented x3, not in any acute distress. HEENT: anicteric sclera, pupils reactive to light and accommodation, EOMI CVS: S1-S2 clear, no murmur rubs or gallops Chest: clear to auscultation bilaterally, no wheezing, rales or rhonchi Abdomen: soft nontender, nondistended, normal bowel sounds, no organomegaly Extremities: no cyanosis, clubbing or edema noted bilaterally Neuro: Cranial nerves II-XII intact, no focal neurological deficits  Discharge Instructions   Discharge Instructions    Diet - low sodium heart healthy    Complete by:  As directed      Increase activity slowly    Complete by:  As directed           Current Discharge Medication List    START taking these medications   Details  ferrous sulfate 324 (65 FE) MG TBEC Take 1 tablet (325 mg total) by mouth daily. Qty: 30 tablet, Refills: 0    pantoprazole (PROTONIX) 40 MG tablet Take 1 tablet (40 mg total) by mouth daily. Qty: 60 tablet,  Refills: 0      CONTINUE these medications which have NOT CHANGED   Details  allopurinol (ZYLOPRIM) 300 MG tablet Take 300 mg by mouth daily.    cholecalciferol (VITAMIN D) 1000 UNITS tablet Take 1,000 Units by mouth daily.    lisinopril (PRINIVIL,ZESTRIL) 20 MG tablet Take 20 mg by mouth daily.       No Known Allergies Follow-up Information    Follow up with DRAPER,TIMOTHY RYAN, DO In 1 week.   Specialties:  Sports Medicine, Family Medicine   Contact information:   [id number] [place] [address] [phone]        The results of significant diagnostics from this hospitalization (including imaging, microbiology, ancillary and laboratory) are listed below for reference.    Significant Diagnostic Studies: Dg Chest Port 1 View  09/28/2015  CLINICAL DATA:  Acute upper GI bleed. EXAM: PORTABLE CHEST 1 VIEW 8:07 a.m. COMPARISON:  09/28/2015 at 6:26 a.m. and 09/24/2012 FINDINGS: There is slight diffuse interstitial accentuation which is new since the prior exam, probably representing slight pulmonary edema. The heart size and pulmonary vascularity are normal. No infiltrates or effusions. No osseous abnormality. IMPRESSION: Subtle slight interstitial edema. Electronically Signed   By: Lorriane Shire M.D.   On: 09/28/2015 08:25   Dg Abd Acute W/chest  09/28/2015  CLINICAL DATA:  Generalized abdominal burning for 2 weeks. Nausea and vomiting. Initial encounter. EXAM: DG ABDOMEN ACUTE W/ 1V CHEST COMPARISON:  Chest radiograph performed 09/24/2012 FINDINGS: The lungs are well-aerated and clear. There is no evidence of focal opacification, pleural effusion or pneumothorax. The cardiomediastinal silhouette is within normal limits. The visualized bowel gas pattern is unremarkable. Scattered stool and air are seen within the colon; there is no evidence of small bowel dilatation to suggest obstruction. No free intra-abdominal air is identified on the provided upright view. No acute  osseous abnormalities are seen; the sacroiliac joints are unremarkable in appearance. IMPRESSION: 1. Unremarkable bowel gas pattern; no free intra-abdominal air seen. Small amount of stool noted in the colon. 2. No acute cardiopulmonary process seen. Electronically Signed   By: Garald Balding M.D.   On: 09/28/2015 06:53    Microbiology: Recent Results (from the past 240 hour(s))  MRSA PCR Screening     Status: Abnormal   Collection Time: 09/28/15  6:33 PM  Result Value Ref Range Status   MRSA by PCR POSITIVE (A) NEGATIVE Final    Comment:        The GeneXpert MRSA Assay (FDA approved for NASAL specimens only), is one component of a comprehensive MRSA colonization surveillance program. It is not intended to diagnose MRSA infection nor to guide or monitor treatment for MRSA infections. RESULT CALLED TO, READ BACK BY AND VERIFIED WITHLoletha Grayer [facility] RN 2134 09/28/15 A NAVARRO      Labs: Basic Metabolic Panel:  Recent Labs Lab 09/28/15 0619 09/28/15 0851 09/29/15 0155 09/30/15 0408 10/01/15 0424  NA 137 134* 140 140 134*  K 3.7 4.5 3.4* 3.6 3.9  CL 100* 108 109 108 103  CO2  --  19* 27 28 28   GLUCOSE 163* 187* 106* 104* 110*  BUN 34* 31* 12 6 7   CREATININE 0.80 0.67 0.77 0.78 0.81  CALCIUM  --  7.4*  7.9* 8.2* 8.0*  MG  --  1.5* 1.9 2.2 1.8  PHOS  --  2.0* 1.9* 2.4* 3.5   Liver Function Tests:  Recent Labs Lab 09/28/15 0600 09/28/15 0646 09/28/15 0851  AST 28 22 22   ALT 50 40 42  ALKPHOS 62 52 55  BILITOT 1.3* 0.9 0.8  PROT 5.8* 4.5* 5.3*  ALBUMIN 3.2* 2.5* 3.0*    Recent Labs Lab 09/28/15 0851  LIPASE 24  AMYLASE 88   No results for input(s): AMMONIA in the last 168 hours. CBC:  Recent Labs Lab 09/28/15 0600  09/28/15 2040 09/29/15 0155 09/29/15 0820 09/30/15 0408 10/01/15 0424  WBC 9.9  < > 7.9 6.0 5.4 3.9* 6.2  NEUTROABS 6.3  --   --  3.2  --   --   --   HGB 11.2*  < > 8.3* 8.3* 8.1* 8.0* 7.9*  HCT 33.6*  < > 24.6* 24.2* 24.0* 23.6* 22.8*  MCV  93.9  < > 93.2 94.2 93.0 94.8 94.6  PLT 282  < > 219 193 188 190 189  < > = values in this interval not displayed. Cardiac Enzymes:  Recent Labs Lab 09/28/15 0851 09/28/15 1508 09/28/15 2040  TROPONINI <0.03 <0.03 <0.03   BNP: BNP (last 3 results) No results for input(s): BNP in the last 8760 hours.  ProBNP (last 3 results) No results for input(s): PROBNP in the last 8760 hours.  CBG: No results for input(s): GLUCAP in the last 168 hours.     Signed:  Liany Mumpower A  [facility] 10/01/2015, 9:37 AM

## 2016-11-06 DIAGNOSIS — M5116 Intervertebral disc disorders with radiculopathy, lumbar region: Secondary | ICD-10-CM | POA: Diagnosis not present

## 2016-11-06 DIAGNOSIS — M545 Low back pain: Secondary | ICD-10-CM | POA: Diagnosis not present

## 2016-11-12 DIAGNOSIS — M5116 Intervertebral disc disorders with radiculopathy, lumbar region: Secondary | ICD-10-CM | POA: Diagnosis not present

## 2016-11-12 DIAGNOSIS — M545 Low back pain: Secondary | ICD-10-CM | POA: Diagnosis not present

## 2016-11-14 DIAGNOSIS — K1120 Sialoadenitis, unspecified: Secondary | ICD-10-CM | POA: Diagnosis not present

## 2016-12-03 IMAGING — CR DG ABDOMEN ACUTE W/ 1V CHEST
4 series · 4 of 4 positions shown · non-contrast
Comparison: Chest radiograph performed 09/24/2012

CLINICAL DATA: Generalized abdominal burning for 2 weeks. Nausea
and vomiting. Initial encounter.

EXAM:
DG ABDOMEN ACUTE W/ 1V CHEST

[w chest pa]
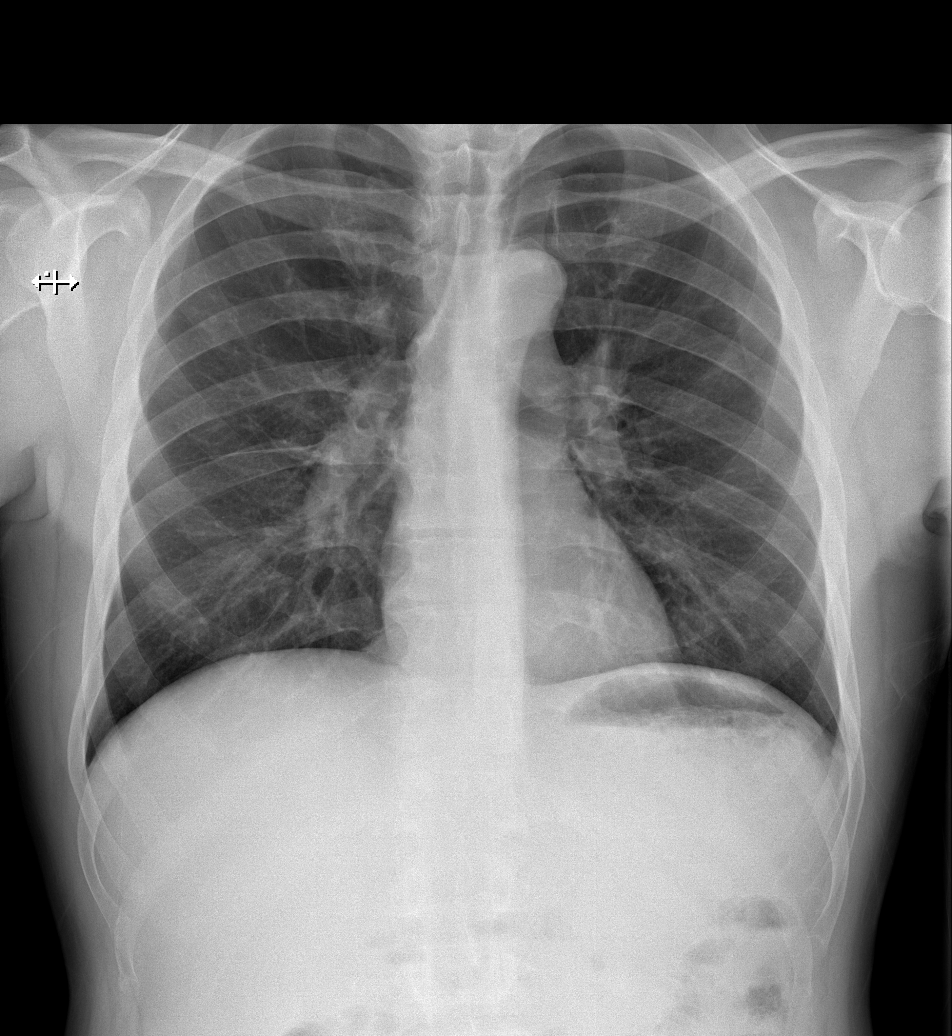

[w abdomen upright]
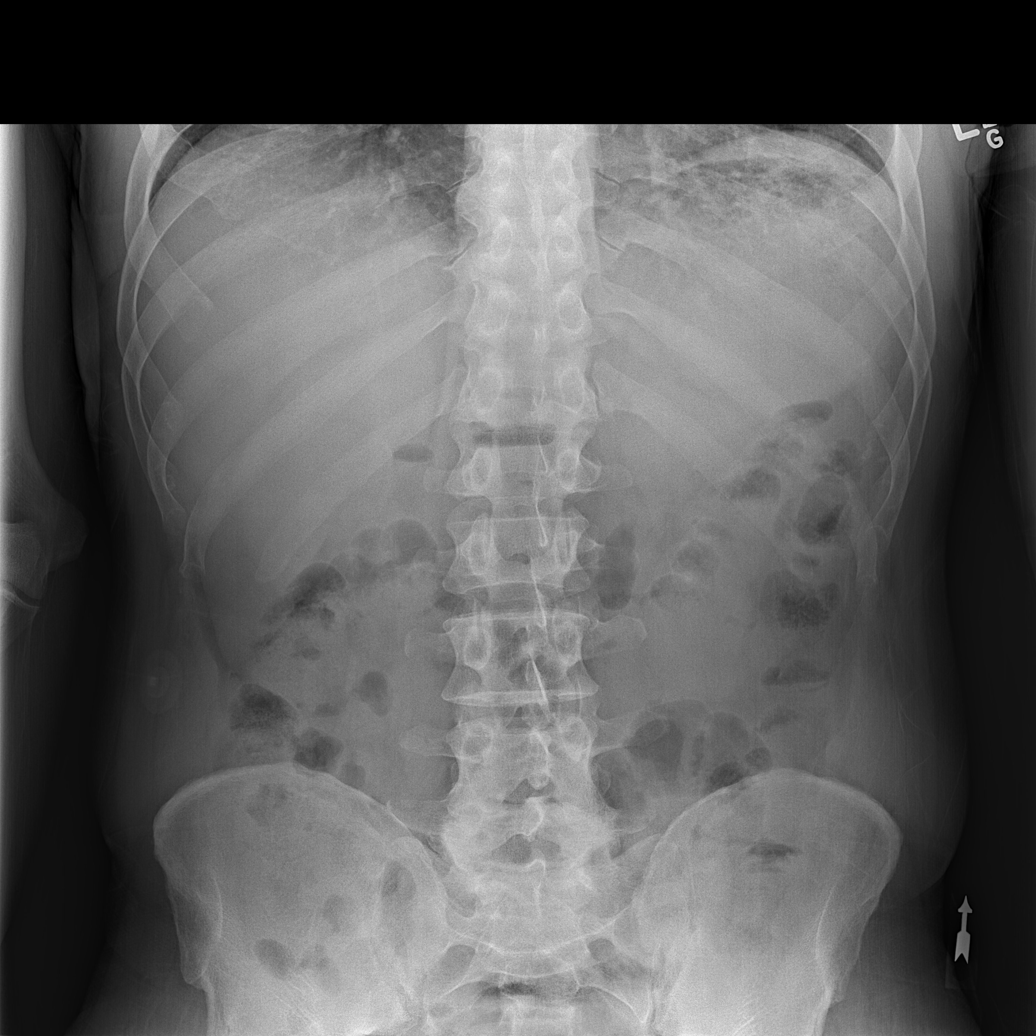

[t abdomen supine (1 of 2)]
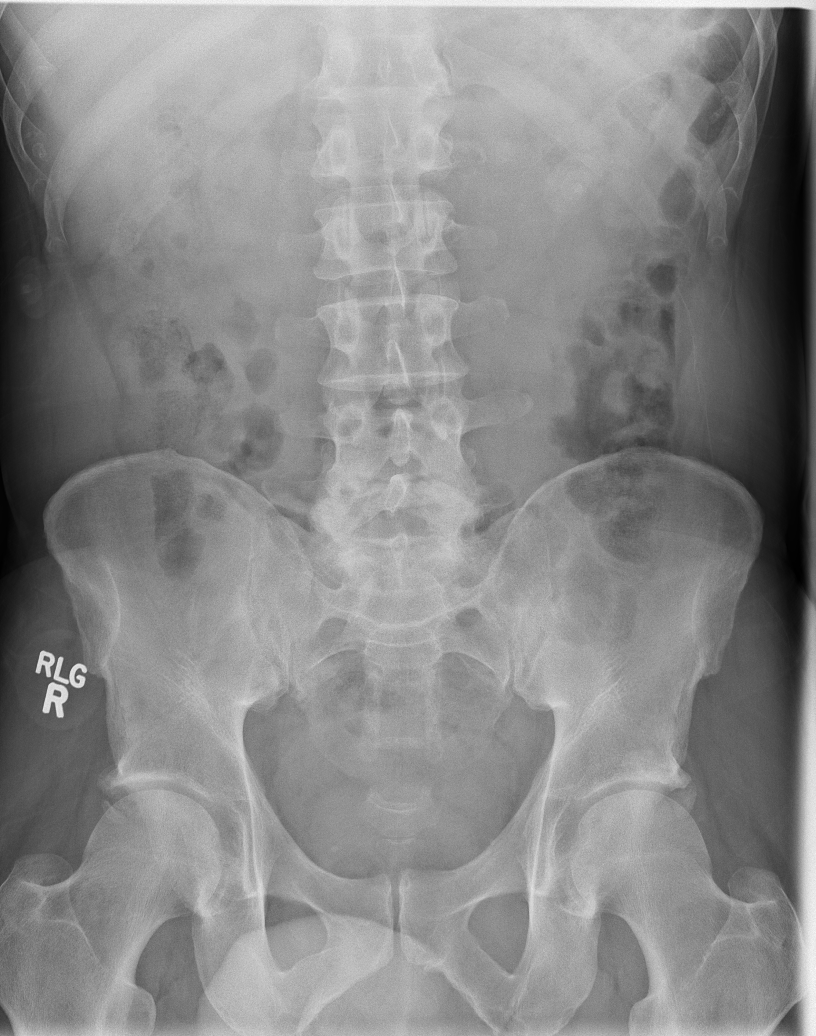

[t abdomen supine (2 of 2)]
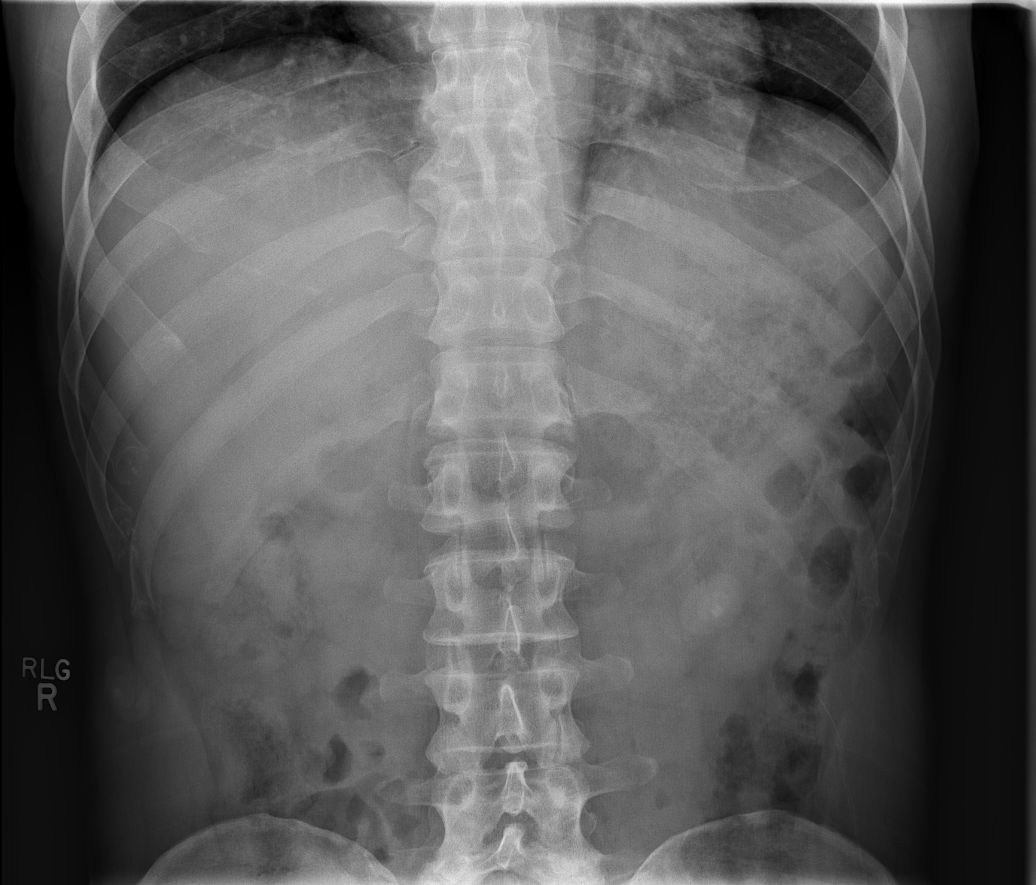

[4 of 4 positions shown; findings below may reference images not displayed]

FINDINGS: The lungs are well-aerated and clear. There is no evidence of focal
opacification, pleural effusion or pneumothorax. The
cardiomediastinal silhouette is within normal limits.

The visualized bowel gas pattern is unremarkable. Scattered stool
and air are seen within the colon; there is no evidence of small
bowel dilatation to suggest obstruction. No free intra-abdominal air
is identified on the provided upright view.

No acute osseous abnormalities are seen; the sacroiliac joints are
unremarkable in appearance.
IMPRESSION: 1. Unremarkable bowel gas pattern; no free intra-abdominal air seen.
Small amount of stool noted in the colon.
2. No acute cardiopulmonary process seen.

## 2016-12-03 IMAGING — DX DG CHEST 1V PORT
1 series · 1 of 1 positions shown · non-contrast
Comparison: 09/28/2015 at [DATE] a.m. and 09/24/2012

CLINICAL DATA: Acute upper GI bleed.

EXAM:
PORTABLE CHEST 1 VIEW [DATE] a.m.

[chest ap]
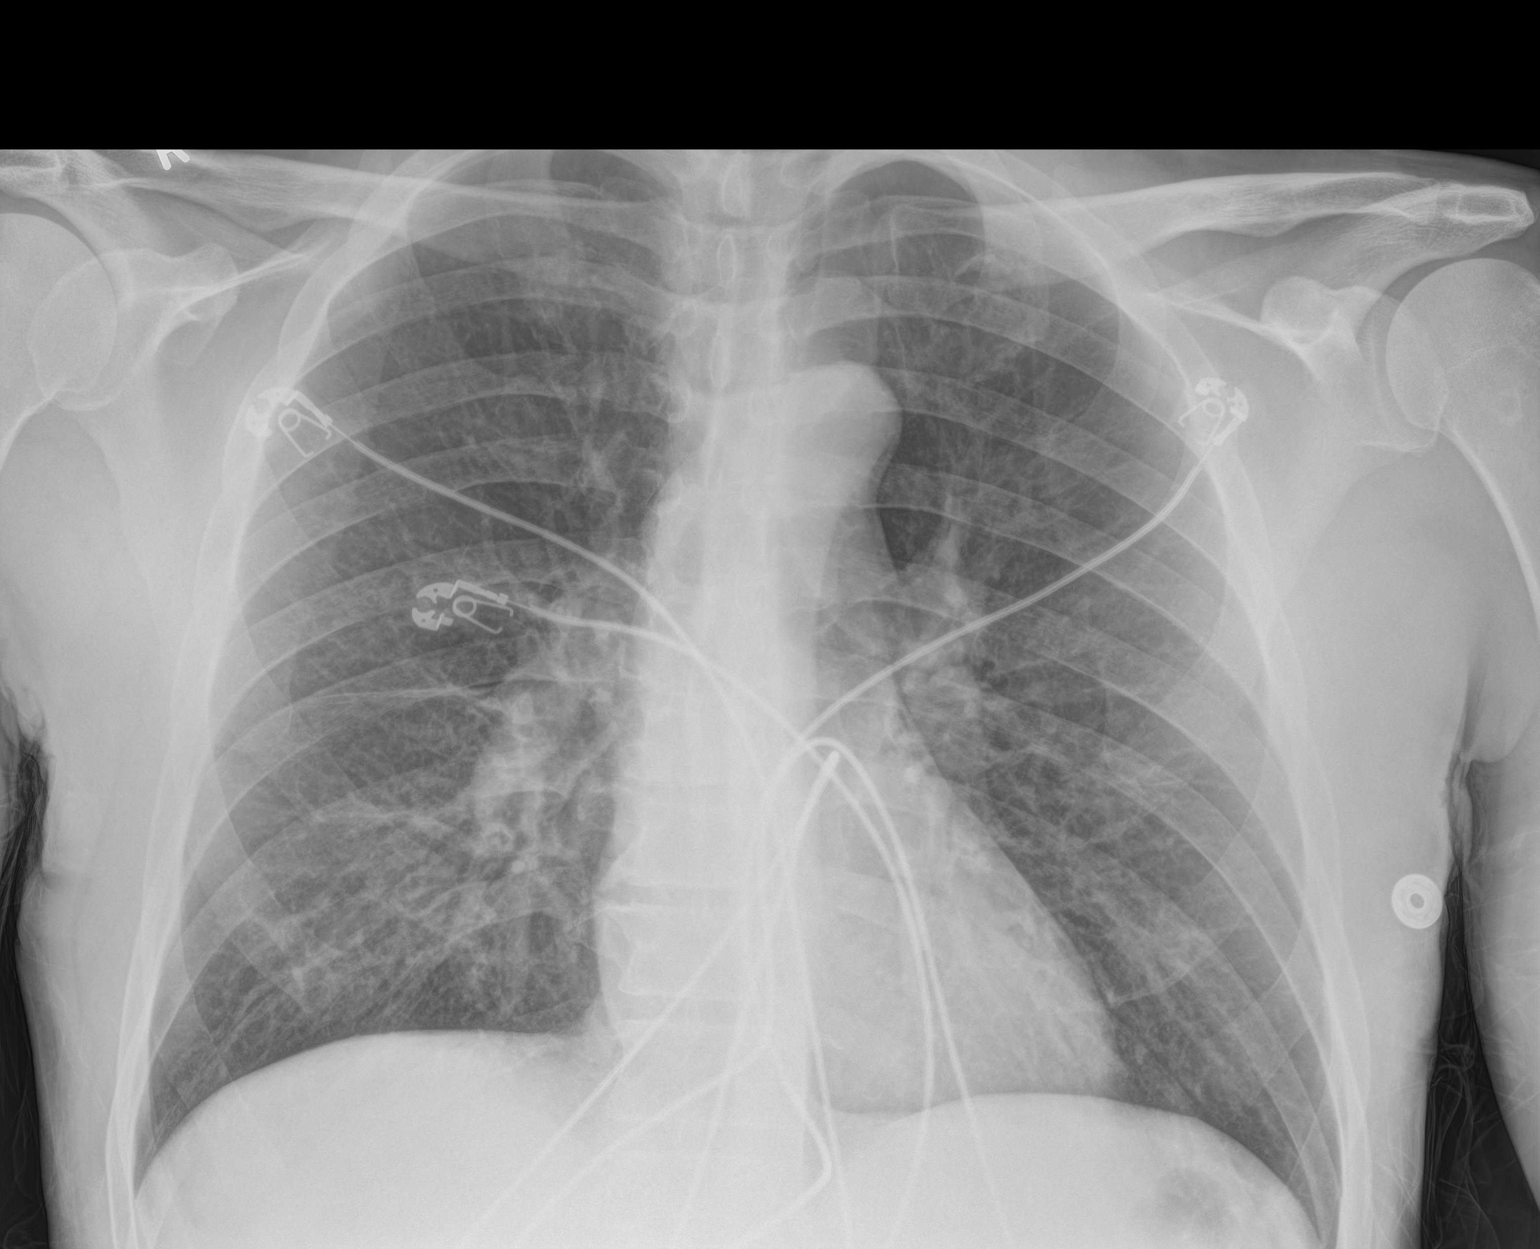

[1 of 1 positions shown; findings below may reference images not displayed]

FINDINGS: There is slight diffuse interstitial accentuation which is new since
the prior exam, probably representing slight pulmonary edema.

The heart size and pulmonary vascularity are normal. No infiltrates
or effusions. No osseous abnormality.
IMPRESSION: Subtle slight interstitial edema.

## 2019-07-11 ENCOUNTER — Inpatient Hospital Stay (HOSPITAL_COMMUNITY)
Admission: EM | Admit: 2019-07-11 | Discharge: 2019-07-13 | DRG: 351 | Disposition: A | Discharge status: Home / Self Care | Payer: No Typology Code available for payment source | Attending: Surgery | Admitting: Surgery

## 2019-07-11 ENCOUNTER — Encounter (HOSPITAL_COMMUNITY): Payer: Self-pay

## 2019-07-11 ENCOUNTER — Emergency Department (HOSPITAL_COMMUNITY): Payer: No Typology Code available for payment source | Admitting: Anesthesiology

## 2019-07-11 ENCOUNTER — Emergency Department (HOSPITAL_COMMUNITY): Payer: No Typology Code available for payment source

## 2019-07-11 ENCOUNTER — Other Ambulatory Visit: Payer: Self-pay

## 2019-07-11 ENCOUNTER — Encounter (HOSPITAL_COMMUNITY): Admission: EM | Disposition: A | Discharge status: Home / Self Care | Payer: Self-pay | Source: Home / Self Care

## 2019-07-11 DIAGNOSIS — C61 Malignant neoplasm of prostate: Secondary | ICD-10-CM | POA: Diagnosis present

## 2019-07-11 DIAGNOSIS — F4310 Post-traumatic stress disorder, unspecified: Secondary | ICD-10-CM | POA: Diagnosis present

## 2019-07-11 DIAGNOSIS — K56609 Unspecified intestinal obstruction, unspecified as to partial versus complete obstruction: Secondary | ICD-10-CM | POA: Diagnosis not present

## 2019-07-11 DIAGNOSIS — K5669 Other partial intestinal obstruction: Secondary | ICD-10-CM | POA: Diagnosis not present

## 2019-07-11 DIAGNOSIS — Z03818 Encounter for observation for suspected exposure to other biological agents ruled out: Secondary | ICD-10-CM | POA: Diagnosis not present

## 2019-07-11 DIAGNOSIS — E872 Acidosis, unspecified: Secondary | ICD-10-CM

## 2019-07-11 DIAGNOSIS — Z20828 Contact with and (suspected) exposure to other viral communicable diseases: Secondary | ICD-10-CM | POA: Diagnosis present

## 2019-07-11 DIAGNOSIS — M109 Gout, unspecified: Secondary | ICD-10-CM | POA: Diagnosis not present

## 2019-07-11 DIAGNOSIS — M1990 Unspecified osteoarthritis, unspecified site: Secondary | ICD-10-CM | POA: Diagnosis not present

## 2019-07-11 DIAGNOSIS — R1031 Right lower quadrant pain: Secondary | ICD-10-CM | POA: Diagnosis present

## 2019-07-11 DIAGNOSIS — Z9079 Acquired absence of other genital organ(s): Secondary | ICD-10-CM | POA: Diagnosis not present

## 2019-07-11 DIAGNOSIS — K4030 Unilateral inguinal hernia, with obstruction, without gangrene, not specified as recurrent: Secondary | ICD-10-CM | POA: Diagnosis present

## 2019-07-11 DIAGNOSIS — I10 Essential (primary) hypertension: Secondary | ICD-10-CM | POA: Diagnosis present

## 2019-07-11 DIAGNOSIS — N179 Acute kidney failure, unspecified: Secondary | ICD-10-CM | POA: Diagnosis not present

## 2019-07-11 HISTORY — PX: INGUINAL HERNIA REPAIR: SHX194

## 2019-07-11 LAB — URINALYSIS, ROUTINE W REFLEX MICROSCOPIC
Bacteria, UA: NONE SEEN
Bilirubin Urine: NEGATIVE
Glucose, UA: NEGATIVE mg/dL
Hgb urine dipstick: NEGATIVE
Ketones, ur: NEGATIVE mg/dL
Leukocytes,Ua: NEGATIVE
Nitrite: NEGATIVE
Protein, ur: 100 mg/dL — AB
Specific Gravity, Urine: 1.021 (ref 1.005–1.030)
pH: 5 (ref 5.0–8.0)

## 2019-07-11 LAB — COMPREHENSIVE METABOLIC PANEL
ALT: 52 U/L — ABNORMAL HIGH (ref 0–44)
AST: 54 U/L — ABNORMAL HIGH (ref 15–41)
Albumin: 5.4 g/dL — ABNORMAL HIGH (ref 3.5–5.0)
Alkaline Phosphatase: 110 U/L (ref 38–126)
Anion gap: 21 — ABNORMAL HIGH (ref 5–15)
BUN: 17 mg/dL (ref 6–20)
CO2: 18 mmol/L — ABNORMAL LOW (ref 22–32)
Calcium: 10.8 mg/dL — ABNORMAL HIGH (ref 8.9–10.3)
Chloride: 100 mmol/L (ref 98–111)
Creatinine, Ser: 2.28 mg/dL — ABNORMAL HIGH (ref 0.61–1.24)
GFR calc Af Amer: 37 mL/min — ABNORMAL LOW (ref >60)
GFR calc non Af Amer: 32 mL/min — ABNORMAL LOW (ref >60)
Glucose, Bld: 213 mg/dL — ABNORMAL HIGH (ref 70–99)
Potassium: 3.8 mmol/L (ref 3.5–5.1)
Sodium: 139 mmol/L (ref 135–145)
Total Bilirubin: 0.9 mg/dL (ref 0.3–1.2)
Total Protein: 9.9 g/dL — ABNORMAL HIGH (ref 6.5–8.1)

## 2019-07-11 LAB — CBC WITH DIFFERENTIAL/PLATELET
Abs Immature Granulocytes: 0.13 10*3/uL — ABNORMAL HIGH (ref 0.00–0.07)
Basophils Absolute: 0.1 10*3/uL (ref 0.0–0.1)
Basophils Relative: 1 %
Eosinophils Absolute: 0 10*3/uL (ref 0.0–0.5)
Eosinophils Relative: 0 %
HCT: 55.6 % — ABNORMAL HIGH (ref 39.0–52.0)
Hemoglobin: 18.3 g/dL — ABNORMAL HIGH (ref 13.0–17.0)
Immature Granulocytes: 1 %
Lymphocytes Relative: 5 %
Lymphs Abs: 0.8 10*3/uL (ref 0.7–4.0)
MCH: 29.3 pg (ref 26.0–34.0)
MCHC: 32.9 g/dL (ref 30.0–36.0)
MCV: 89 fL (ref 80.0–100.0)
Monocytes Absolute: 1 10*3/uL (ref 0.1–1.0)
Monocytes Relative: 6 %
Neutro Abs: 14.6 10*3/uL — ABNORMAL HIGH (ref 1.7–7.7)
Neutrophils Relative %: 87 %
Platelets: 339 10*3/uL (ref 150–400)
RBC: 6.25 MIL/uL — ABNORMAL HIGH (ref 4.22–5.81)
RDW: 15.7 % — ABNORMAL HIGH (ref 11.5–15.5)
WBC: 16.5 10*3/uL — ABNORMAL HIGH (ref 4.0–10.5)
nRBC: 0 % (ref 0.0–0.2)

## 2019-07-11 LAB — LACTIC ACID, PLASMA: Lactic Acid, Venous: 5.8 mmol/L (ref 0.5–1.9)

## 2019-07-11 LAB — SARS CORONAVIRUS 2 BY RT PCR (HOSPITAL ORDER, PERFORMED IN ~~LOC~~ HOSPITAL LAB): SARS Coronavirus 2: NEGATIVE

## 2019-07-11 LAB — LIPASE, BLOOD: Lipase: 22 U/L (ref 11–51)

## 2019-07-11 SURGERY — REPAIR, HERNIA, INGUINAL, ADULT
Anesthesia: General | Site: Inguinal | Laterality: Right

## 2019-07-11 MED ORDER — LABETALOL HCL 5 MG/ML IV SOLN
INTRAVENOUS | Status: AC
  Filled 2019-07-11: qty 4

## 2019-07-11 MED ORDER — LACTATED RINGERS IV SOLN
INTRAVENOUS | Status: DC | PRN
  Administered 2019-07-11: 17:00:00 via INTRAVENOUS

## 2019-07-11 MED ORDER — SODIUM CHLORIDE 0.9 % IV BOLUS
1000.0000 mL | Freq: Once | INTRAVENOUS | Status: AC
Start: 2019-07-11 — End: 2019-07-11
  Administered 2019-07-11: 1000 mL via INTRAVENOUS

## 2019-07-11 MED ORDER — ROCURONIUM BROMIDE 10 MG/ML (PF) SYRINGE
PREFILLED_SYRINGE | INTRAVENOUS | Status: DC | PRN
  Administered 2019-07-11: 50 mg via INTRAVENOUS
  Administered 2019-07-11: 10 mg via INTRAVENOUS

## 2019-07-11 MED ORDER — LIDOCAINE HCL (CARDIAC) PF 100 MG/5ML IV SOSY
PREFILLED_SYRINGE | INTRAVENOUS | Status: DC | PRN
  Administered 2019-07-11: 60 mg via INTRATRACHEAL

## 2019-07-11 MED ORDER — PHENYLEPHRINE HCL (PRESSORS) 10 MG/ML IV SOLN
INTRAVENOUS | Status: AC
  Filled 2019-07-11: qty 1

## 2019-07-11 MED ORDER — DEXAMETHASONE SODIUM PHOSPHATE 10 MG/ML IJ SOLN
INTRAMUSCULAR | Status: DC | PRN
  Administered 2019-07-11: 10 mg via INTRAVENOUS

## 2019-07-11 MED ORDER — ALBUMIN HUMAN 5 % IV SOLN
INTRAVENOUS | Status: DC | PRN
  Administered 2019-07-11: 17:00:00 via INTRAVENOUS

## 2019-07-11 MED ORDER — BUPIVACAINE HCL (PF) 0.5 % IJ SOLN
INTRAMUSCULAR | Status: DC | PRN
  Administered 2019-07-11: 23 mL

## 2019-07-11 MED ORDER — SUCCINYLCHOLINE CHLORIDE 200 MG/10ML IV SOSY
PREFILLED_SYRINGE | INTRAVENOUS | Status: DC | PRN
  Administered 2019-07-11: 120 mg via INTRAVENOUS

## 2019-07-11 MED ORDER — SUGAMMADEX SODIUM 200 MG/2ML IV SOLN
INTRAVENOUS | Status: DC | PRN
  Administered 2019-07-11: 200 mg via INTRAVENOUS

## 2019-07-11 MED ORDER — SODIUM CHLORIDE 0.9 % IR SOLN
Status: DC | PRN
  Administered 2019-07-11: 3000 mL

## 2019-07-11 MED ORDER — DEXAMETHASONE SODIUM PHOSPHATE 10 MG/ML IJ SOLN
INTRAMUSCULAR | Status: AC
  Filled 2019-07-11: qty 1

## 2019-07-11 MED ORDER — SODIUM CHLORIDE 0.9 % IV SOLN
INTRAVENOUS | Status: DC | PRN
  Administered 2019-07-11: 60 ug/min via INTRAVENOUS

## 2019-07-11 MED ORDER — OXYCODONE HCL 5 MG PO TABS
5.0000 mg | ORAL_TABLET | ORAL | Status: DC | PRN
  Administered 2019-07-11 – 2019-07-12 (×2): 10 mg via ORAL
  Filled 2019-07-11 (×2): qty 2

## 2019-07-11 MED ORDER — PHENYLEPHRINE 40 MCG/ML (10ML) SYRINGE FOR IV PUSH (FOR BLOOD PRESSURE SUPPORT)
PREFILLED_SYRINGE | INTRAVENOUS | Status: DC | PRN
  Administered 2019-07-11 (×2): 100 ug via INTRAVENOUS
  Administered 2019-07-11: 80 ug via INTRAVENOUS

## 2019-07-11 MED ORDER — FENTANYL CITRATE (PF) 100 MCG/2ML IJ SOLN
INTRAMUSCULAR | Status: AC
  Filled 2019-07-11: qty 2

## 2019-07-11 MED ORDER — ONDANSETRON HCL 4 MG/2ML IJ SOLN
4.0000 mg | Freq: Four times a day (QID) | INTRAMUSCULAR | Status: DC | PRN
  Administered 2019-07-11: 4 mg via INTRAVENOUS
  Filled 2019-07-11: qty 2

## 2019-07-11 MED ORDER — CHLORHEXIDINE GLUCONATE CLOTH 2 % EX PADS
6.0000 | MEDICATED_PAD | Freq: Every day | CUTANEOUS | Status: DC
  Administered 2019-07-11 – 2019-07-13 (×3): 6 via TOPICAL

## 2019-07-11 MED ORDER — ORAL CARE MOUTH RINSE
15.0000 mL | Freq: Two times a day (BID) | OROMUCOSAL | Status: DC
  Administered 2019-07-11 – 2019-07-12 (×2): 15 mL via OROMUCOSAL

## 2019-07-11 MED ORDER — HYDROMORPHONE HCL 1 MG/ML IJ SOLN
0.5000 mg | Freq: Once | INTRAMUSCULAR | Status: AC
  Administered 2019-07-11: 12:00:00 0.5 mg via INTRAVENOUS
  Filled 2019-07-11: qty 1

## 2019-07-11 MED ORDER — SODIUM CHLORIDE 0.9 % IV SOLN
INTRAVENOUS | Status: DC | PRN
  Administered 2019-07-11: 16:00:00 via INTRAVENOUS

## 2019-07-11 MED ORDER — SODIUM CHLORIDE 0.9 % IV BOLUS
1000.0000 mL | Freq: Once | INTRAVENOUS | Status: AC
  Administered 2019-07-11: 13:00:00 1000 mL via INTRAVENOUS

## 2019-07-11 MED ORDER — PHENYLEPHRINE 40 MCG/ML (10ML) SYRINGE FOR IV PUSH (FOR BLOOD PRESSURE SUPPORT)
PREFILLED_SYRINGE | INTRAVENOUS | Status: AC
  Filled 2019-07-11: qty 20

## 2019-07-11 MED ORDER — ENOXAPARIN SODIUM 30 MG/0.3ML ~~LOC~~ SOLN
30.0000 mg | SUBCUTANEOUS | Status: DC
  Administered 2019-07-12: 30 mg via SUBCUTANEOUS
  Filled 2019-07-11: qty 0.3

## 2019-07-11 MED ORDER — MIDAZOLAM HCL 5 MG/5ML IJ SOLN
INTRAMUSCULAR | Status: DC | PRN
  Administered 2019-07-11: 2 mg via INTRAVENOUS

## 2019-07-11 MED ORDER — LACTATED RINGERS IV SOLN
INTRAVENOUS | Status: DC | PRN
  Administered 2019-07-11 (×2): via INTRAVENOUS

## 2019-07-11 MED ORDER — PROPOFOL 500 MG/50ML IV EMUL
INTRAVENOUS | Status: DC | PRN
  Administered 2019-07-11: 200 mg via INTRAVENOUS

## 2019-07-11 MED ORDER — ONDANSETRON HCL 4 MG/2ML IJ SOLN
INTRAMUSCULAR | Status: AC
  Filled 2019-07-11: qty 2

## 2019-07-11 MED ORDER — ERTAPENEM SODIUM 1 G IJ SOLR
1.0000 g | INTRAMUSCULAR | Status: AC
  Administered 2019-07-11: 1 g via INTRAVENOUS
  Filled 2019-07-11: qty 1

## 2019-07-11 MED ORDER — PIPERACILLIN-TAZOBACTAM 3.375 G IVPB
3.3750 g | Freq: Once | INTRAVENOUS | Status: AC
  Administered 2019-07-11: 3.375 g via INTRAVENOUS
  Filled 2019-07-11: qty 50

## 2019-07-11 MED ORDER — FENTANYL CITRATE (PF) 100 MCG/2ML IJ SOLN
25.0000 ug | INTRAMUSCULAR | Status: DC | PRN
  Administered 2019-07-11: 25 ug via INTRAVENOUS
  Administered 2019-07-11: 50 ug via INTRAVENOUS

## 2019-07-11 MED ORDER — ONDANSETRON HCL 4 MG/2ML IJ SOLN
INTRAMUSCULAR | Status: DC | PRN
  Administered 2019-07-11: 4 mg via INTRAVENOUS

## 2019-07-11 MED ORDER — ROCURONIUM BROMIDE 10 MG/ML (PF) SYRINGE
PREFILLED_SYRINGE | INTRAVENOUS | Status: AC
  Filled 2019-07-11: qty 10

## 2019-07-11 MED ORDER — SUCCINYLCHOLINE CHLORIDE 200 MG/10ML IV SOSY
PREFILLED_SYRINGE | INTRAVENOUS | Status: AC
  Filled 2019-07-11: qty 10

## 2019-07-11 MED ORDER — LACTATED RINGERS IV SOLN: INTRAVENOUS | Status: DC

## 2019-07-11 MED ORDER — PROMETHAZINE HCL 25 MG/ML IJ SOLN: 6.2500 mg | INTRAMUSCULAR | Status: DC | PRN

## 2019-07-11 MED ORDER — ACETAMINOPHEN 650 MG RE SUPP
650.0000 mg | Freq: Four times a day (QID) | RECTAL | Status: DC | PRN
  Filled 2019-07-11: qty 1

## 2019-07-11 MED ORDER — LABETALOL HCL 5 MG/ML IV SOLN
5.0000 mg | INTRAVENOUS | Status: DC | PRN
  Administered 2019-07-11: 5 mg via INTRAVENOUS

## 2019-07-11 MED ORDER — PROPOFOL 10 MG/ML IV BOLUS
INTRAVENOUS | Status: AC
  Filled 2019-07-11: qty 20

## 2019-07-11 MED ORDER — LIDOCAINE 2% (20 MG/ML) 5 ML SYRINGE
INTRAMUSCULAR | Status: AC
  Filled 2019-07-11: qty 5

## 2019-07-11 MED ORDER — ONDANSETRON HCL 4 MG/2ML IJ SOLN
4.0000 mg | Freq: Once | INTRAMUSCULAR | Status: AC
  Administered 2019-07-11: 4 mg via INTRAVENOUS
  Filled 2019-07-11: qty 2

## 2019-07-11 MED ORDER — OXYCODONE HCL 5 MG PO TABS: 5.0000 mg | ORAL_TABLET | Freq: Once | ORAL | Status: DC | PRN

## 2019-07-11 MED ORDER — TRAMADOL HCL 50 MG PO TABS
50.0000 mg | ORAL_TABLET | Freq: Four times a day (QID) | ORAL | Status: DC | PRN
  Administered 2019-07-11: 50 mg via ORAL
  Filled 2019-07-11: qty 1

## 2019-07-11 MED ORDER — KCL IN DEXTROSE-NACL 20-5-0.45 MEQ/L-%-% IV SOLN
INTRAVENOUS | Status: DC
  Administered 2019-07-11 – 2019-07-12 (×4): via INTRAVENOUS
  Filled 2019-07-11 (×6): qty 1000

## 2019-07-11 MED ORDER — ONDANSETRON 4 MG PO TBDP: 4.0000 mg | ORAL_TABLET | Freq: Four times a day (QID) | ORAL | Status: DC | PRN

## 2019-07-11 MED ORDER — FENTANYL CITRATE (PF) 250 MCG/5ML IJ SOLN
INTRAMUSCULAR | Status: AC
  Filled 2019-07-11: qty 5

## 2019-07-11 MED ORDER — ALBUMIN HUMAN 5 % IV SOLN
INTRAVENOUS | Status: AC
  Filled 2019-07-11: qty 250

## 2019-07-11 MED ORDER — OXYCODONE HCL 5 MG/5ML PO SOLN: 5.0000 mg | Freq: Once | ORAL | Status: DC | PRN

## 2019-07-11 MED ORDER — SODIUM CHLORIDE 0.9 % IV BOLUS
1000.0000 mL | Freq: Once | INTRAVENOUS | Status: AC
  Administered 2019-07-11: 12:00:00 1000 mL via INTRAVENOUS

## 2019-07-11 MED ORDER — ACETAMINOPHEN 325 MG PO TABS: 650.0000 mg | ORAL_TABLET | Freq: Four times a day (QID) | ORAL | Status: DC | PRN

## 2019-07-11 MED ORDER — FENTANYL CITRATE (PF) 100 MCG/2ML IJ SOLN
INTRAMUSCULAR | Status: DC | PRN
  Administered 2019-07-11: 100 ug via INTRAVENOUS
  Administered 2019-07-11 (×2): 50 ug via INTRAVENOUS

## 2019-07-11 MED ORDER — MIDAZOLAM HCL 2 MG/2ML IJ SOLN
INTRAMUSCULAR | Status: AC
  Filled 2019-07-11: qty 2

## 2019-07-11 MED ORDER — HYDROMORPHONE HCL 1 MG/ML IJ SOLN: 1.0000 mg | INTRAMUSCULAR | Status: DC | PRN

## 2019-07-11 SURGICAL SUPPLY — 64 items
ADH SKN CLS APL DERMABOND .7 (GAUZE/BANDAGES/DRESSINGS) ×2
APL PRP STRL LF DISP 70% ISPRP (MISCELLANEOUS) ×4
APL SWBSTK 6 STRL LF DISP (MISCELLANEOUS)
APPLICATOR COTTON TIP 6 STRL (MISCELLANEOUS) ×1 IMPLANT
APPLICATOR COTTON TIP 6IN STRL (MISCELLANEOUS)
BLADE EXTENDED COATED 6.5IN (ELECTRODE) IMPLANT
BLADE HEX COATED 2.75 (ELECTRODE) ×4 IMPLANT
BLADE SURG 15 STRL LF DISP TIS (BLADE) ×1 IMPLANT
BLADE SURG 15 STRL SS (BLADE)
CHLORAPREP W/TINT 26 (MISCELLANEOUS) ×8 IMPLANT
CLOSURE WOUND 1/2 X4 (GAUZE/BANDAGES/DRESSINGS)
COVER MAYO STAND STRL (DRAPES) IMPLANT
COVER SURGICAL LIGHT HANDLE (MISCELLANEOUS) ×4 IMPLANT
COVER WAND RF STERILE (DRAPES) ×4 IMPLANT
DECANTER SPIKE VIAL GLASS SM (MISCELLANEOUS) ×4 IMPLANT
DERMABOND ADVANCED (GAUZE/BANDAGES/DRESSINGS) ×2
DERMABOND ADVANCED .7 DNX12 (GAUZE/BANDAGES/DRESSINGS) ×1 IMPLANT
DRAIN PENROSE 18X1/2 LTX STRL (DRAIN) IMPLANT
DRAPE LAPAROSCOPIC ABDOMINAL (DRAPES) ×4 IMPLANT
DRAPE LAPAROTOMY TRNSV 102X78 (DRAPES) ×1 IMPLANT
DRAPE WARM FLUID 44X44 (DRAPES) IMPLANT
ELECT PENCIL ROCKER SW 15FT (MISCELLANEOUS) ×1 IMPLANT
ELECT REM PT RETURN 15FT ADLT (MISCELLANEOUS) ×4 IMPLANT
GAUZE SPONGE 4X4 12PLY STRL (GAUZE/BANDAGES/DRESSINGS) ×1 IMPLANT
GLOVE BIOGEL PI IND STRL 7.0 (GLOVE) ×2 IMPLANT
GLOVE BIOGEL PI INDICATOR 7.0 (GLOVE) ×2
GLOVE SURG ORTHO 8.0 STRL STRW (GLOVE) ×4 IMPLANT
GOWN STRL REUS W/TWL LRG LVL3 (GOWN DISPOSABLE) ×4 IMPLANT
GOWN STRL REUS W/TWL XL LVL3 (GOWN DISPOSABLE) ×8 IMPLANT
HANDLE SUCTION POOLE (INSTRUMENTS) ×1 IMPLANT
KIT BASIN OR (CUSTOM PROCEDURE TRAY) ×4 IMPLANT
KIT TURNOVER KIT A (KITS) IMPLANT
MESH ULTRAPRO 3X6 7.6X15CM (Mesh General) ×3 IMPLANT
NDL HYPO 25X1 1.5 SAFETY (NEEDLE) ×1 IMPLANT
NEEDLE HYPO 25X1 1.5 SAFETY (NEEDLE) ×4 IMPLANT
NS IRRIG 1000ML POUR BTL (IV SOLUTION) ×4 IMPLANT
PACK BASIC VI WITH GOWN DISP (CUSTOM PROCEDURE TRAY) ×4 IMPLANT
PACK GENERAL/GYN (CUSTOM PROCEDURE TRAY) ×4 IMPLANT
SPONGE LAP 18X18 RF (DISPOSABLE) IMPLANT
SPONGE LAP 4X18 RFD (DISPOSABLE) ×3 IMPLANT
STAPLER VISISTAT 35W (STAPLE) ×4 IMPLANT
STRIP CLOSURE SKIN 1/2X4 (GAUZE/BANDAGES/DRESSINGS) ×1 IMPLANT
SUCTION POOLE HANDLE (INSTRUMENTS) ×4
SUT MNCRL AB 4-0 PS2 18 (SUTURE) ×1 IMPLANT
SUT NOV 1 T60/GS (SUTURE) IMPLANT
SUT NOVA NAB GS-21 0 18 T12 DT (SUTURE) IMPLANT
SUT NOVA NAB GS-22 2 0 T19 (SUTURE) ×4 IMPLANT
SUT SILK 2 0 (SUTURE)
SUT SILK 2 0 SH (SUTURE) ×1 IMPLANT
SUT SILK 2 0 SH CR/8 (SUTURE) IMPLANT
SUT SILK 2-0 18XBRD TIE 12 (SUTURE) IMPLANT
SUT SILK 3 0 (SUTURE)
SUT SILK 3 0 SH CR/8 (SUTURE) IMPLANT
SUT SILK 3-0 18XBRD TIE 12 (SUTURE) IMPLANT
SUT VIC AB 3-0 SH 18 (SUTURE) ×1 IMPLANT
SUT VICRYL 2 0 18  UND BR (SUTURE)
SUT VICRYL 2 0 18 UND BR (SUTURE) IMPLANT
SYR BULB IRRIGATION 50ML (SYRINGE) ×1 IMPLANT
SYR CONTROL 10ML LL (SYRINGE) ×4 IMPLANT
TOWEL OR 17X26 10 PK STRL BLUE (TOWEL DISPOSABLE) ×5 IMPLANT
TRAY FOLEY MTR SLVR 16FR STAT (SET/KITS/TRAYS/PACK) IMPLANT
WATER STERILE IRR 1000ML POUR (IV SOLUTION) ×4 IMPLANT
YANKAUER SUCT BULB TIP 10FT TU (MISCELLANEOUS) ×4 IMPLANT
YANKAUER SUCT BULB TIP NO VENT (SUCTIONS) IMPLANT

## 2019-07-11 NOTE — Op Note (Signed)
Inguinal Hernia, Open, Procedure Note  Pre-operative Diagnosis:  Incarcerated right inguinal hernia with small bowel obstruction  Post-operative Diagnosis: same  Surgeon:  Earnstine Regal, MD, FACS  Anesthesia:  General  Preparation:  Chlora-prep  Estimated Blood Loss: minimal  Complications:  none  Indications: Patient is a 53 yo BM with one week hx of abdominal pain, mainly RLQ.  Patient had undergone prostate resection for cancer at [facility] on 06/14/2019.  Pain persisted and became more severe.  Normal BM's until yesterday.  Developed nausea and emesis last night.  Presented to ER for evaluation.  WBC elevated, lactate level elevated, creatinine elevated.  CT abd/pelvis with evidence of incarcerated RIH with small bowel obstruction.  No obvious sign of ischemia or infarction. Patient now comes to OR urgently for exploration.  Procedure Details  The patient was evaluated in the holding area. All of the patient's questions were answered and the proposed procedure was confirmed. The site of the procedure was properly marked. The patient was taken to the Operating Room, identified by name, and the procedure verified as inguinal hernia repair.  The patient was placed in the supine position and underwent induction of anesthesia. A "Time Out" was performed per routine. The entire abdomen and groin were prepped and draped in the usual aseptic fashion.  After ascertaining that an adequate level of anesthesia had been obtained, an incision was made in the right groin with a #10 blade.  Dissection was carried through the subcutaneous tissues and hemostasis obtained with the electrocautery.  A Gelpi retractor was placed for exposure.  The external oblique fascia was incised in line with it's fibers and extended through the external inguinal ring.  The cord structures were dissected out of the inguinal canal and encircled with a Penrose drain.  The floor of the inguinal canal was dissected out.  There was no  sign of direct hernia defect.  The cord was explored and the indirect hernia sac was opened.  The entire cord was edematous but tissue was viable.  The sac was opened and contained ascitic fluid.  There was a loop of small bowel in the hernia sac which was edematous but viable without any sign of ischemia.  It was gently reduced through the internal ring back into the peritoneal cavity where it moved freely.  The neck of the hernia sac was closed with a 0-Novofil pursestring suture.  The lining of the sac was cauterized with the electrocautery.  The floor of the inguinal canal was reconstructed with Ethicon Ultrapro mesh cut to the appropriate dimensions.  It was secured to the pubic tubercle with a 2-0 Novafil suture and along the inguinal ligament with a running 2-0 Novafil suture.  Mesh was split to accommodate the cord structures.  The superior margin of the mesh was secured to the transversalis and internal oblique musculature with interrupted 2-0 Novafil sutures.  The tails of the mesh were overlapped lateral to the cord structures and secured to the inguinal ligament with interrupted 2-0 Novafil sutures to recreate the internal inguinal ring.  Cord structures were returned to the inguinal canal.  Local anesthetic was infiltrated throughout the field.  External oblique fascia was closed with interrupted 3-0 Vicryl sutures.  Subcutaneous tissues were closed with interrupted 3-0 Vicryl sutures.  Skin was anesthetized with local anesthetic, and the skin edges were re-approximated with a running 4-0 Monocryl suture.  Wound was washed and dried and Dermabond was applied.  Instrument, sponge, and needle counts were correct prior to  closure and at the conclusion of the case.  The patient tolerated the procedure well.  The patient was awakened from anesthesia and brought to the recovery room in stable condition.  [name], MD [facility] Office: [phone]

## 2019-07-11 NOTE — H&P (Signed)
Jared [name] is an 53 y.o. male.    General Surgery [facility]  Chief Complaint: incarcerated RIH with small bowel obstruction, possible ishemia  HPI: Patient is a 53 yo BM with one week hx of abdominal pain, mainly RLQ.  Patient had undergone prostate resection for cancer at [facility] on 06/14/2019.  Pain persisted and became more severe.  Normal BM's until yesterday.  Developed nausea and emesis last night.  Presented to ER for evaluation.  WBC elevated, lactate level elevated, creatinine elevated.  CT abd/pelvis with evidence of incarcerated RIH with small bowel obstruction.  No obvious sign of ischemia or infarction.  Previous abd surgery for prostate and for bleeding ulcer.  No prior hx of hernia.  Medical hx notable for HTN.  Wife at bedside.  Past Medical History:  Diagnosis Date  . Alcohol abuse   . Arthritis   . Depression   . Gout   . Hypertension 1984  . PTSD (post-traumatic stress disorder)    Nature conservation officer; retired Corporate treasurer    Past Surgical History:  Procedure Laterality Date  . ANKLE ARTHROSCOPY  2005.2006   bilateral  . BUNIONECTOMY  1994   rt   foot  . ESOPHAGOGASTRODUODENOSCOPY N/A 09/28/2015   Procedure: ESOPHAGOGASTRODUODENOSCOPY (EGD);  Surgeon: Wilford Corner, MD;  Location: [facility];  Service: Endoscopy;  Laterality: N/A;  . KNEE ARTHROSCOPY  [id number]   bilateral  . LUMBAR LAMINECTOMY/DECOMPRESSION MICRODISCECTOMY  09/29/2012   Procedure: LUMBAR LAMINECTOMY/DECOMPRESSION MICRODISCECTOMY 2 LEVELS;  Surgeon: Kristeen Miss, MD;  Location: [facility];  Service: Neurosurgery;  Laterality: Left;  Left Lumbar four-five, Lumbar five-Sacral one Microdiskectomy  . TONSILLECTOMY      History reviewed. No pertinent family history. Social History:  reports that he has never smoked. He has never used smokeless tobacco. He reports current alcohol use of about 6.0 standard drinks of alcohol per week. He reports current drug use. Drug:  Marijuana.  Allergies: No Known Allergies  (Not in a hospital admission)   Results for orders placed or performed during the hospital encounter of 07/11/19 (from the past 48 hour(s))  Lipase, blood     Status: None   Collection Time: 07/11/19 10:54 AM  Result Value Ref Range   Lipase 22 11 - 51 U/L    Comment: Performed at [facility], [place] [address] [place], [place] [address]  Comprehensive metabolic panel     Status: Abnormal   Collection Time: 07/11/19 10:54 AM  Result Value Ref Range   Sodium 139 135 - 145 mmol/L   Potassium 3.8 3.5 - 5.1 mmol/L   Chloride 100 98 - 111 mmol/L   CO2 18 (L) 22 - 32 mmol/L   Glucose, Bld 213 (H) 70 - 99 mg/dL   BUN 17 6 - 20 mg/dL   Creatinine, Ser 2.28 (H) 0.61 - 1.24 mg/dL   Calcium 10.8 (H) 8.9 - 10.3 mg/dL   Total Protein 9.9 (H) 6.5 - 8.1 g/dL   Albumin 5.4 (H) 3.5 - 5.0 g/dL   AST 54 (H) 15 - 41 U/L   ALT 52 (H) 0 - 44 U/L   Alkaline Phosphatase 110 38 - 126 U/L   Total Bilirubin 0.9 0.3 - 1.2 mg/dL   GFR calc non Af Amer 32 (L) >60 mL/min   GFR calc Af Amer 37 (L) >60 mL/min   Anion gap 21 (H) 5 - 15    Comment: Performed at [facility], [place] [address], [place], [place] [address]  CBC with Differential/Platelet     Status: Abnormal   Collection Time: 07/11/19 10:54 AM  Result Value Ref Range   WBC 16.5 (H) 4.0 - 10.5 K/uL   RBC 6.25 (H) 4.22 - 5.81 MIL/uL   Hemoglobin 18.3 (H) 13.0 - 17.0 g/dL   HCT 55.6 (H) 39.0 - 52.0 %   MCV 89.0 80.0 - 100.0 fL   MCH 29.3 26.0 - 34.0 pg   MCHC 32.9 30.0 - 36.0 g/dL   RDW 15.7 (H) 11.5 - 15.5 %   Platelets 339 150 - 400 K/uL   nRBC 0.0 0.0 - 0.2 %   Neutrophils Relative % 87 %   Neutro Abs 14.6 (H) 1.7 - 7.7 K/uL   Lymphocytes Relative 5 %   Lymphs Abs 0.8 0.7 - 4.0 K/uL   Monocytes Relative 6 %   Monocytes Absolute 1.0 0.1 - 1.0 K/uL   Eosinophils Relative 0 %   Eosinophils Absolute 0.0 0.0 - 0.5 K/uL   Basophils Relative 1 %   Basophils  Absolute 0.1 0.0 - 0.1 K/uL   Immature Granulocytes 1 %   Abs Immature Granulocytes 0.13 (H) 0.00 - 0.07 K/uL    Comment: Performed at [facility], [place] [address] [place], [place] [address]  Lactic acid, plasma     Status: Abnormal   Collection Time: 07/11/19 10:54 AM  Result Value Ref Range   Lactic Acid, Venous 5.8 (HH) 0.5 - 1.9 mmol/L    Comment: CRITICAL RESULT CALLED TO, READ BACK BY AND VERIFIED WITH: [facility] [id number] @1202  BY V.WILKINS Performed at [facility], [place] [address] [place], [place] [address]   SARS Coronavirus 2 [facility] order, Performed in [facility] hospital lab) Nasopharyngeal Nasopharyngeal Swab     Status: None   Collection Time: 07/11/19  1:14 PM   Specimen: Nasopharyngeal Swab  Result Value Ref Range   SARS Coronavirus 2 NEGATIVE NEGATIVE    Comment: (NOTE) If result is NEGATIVE SARS-CoV-2 target nucleic acids are NOT DETECTED. The SARS-CoV-2 RNA is generally detectable in upper and lower  respiratory specimens during the acute phase of infection. The lowest  concentration of SARS-CoV-2 viral copies this assay can detect is 250  copies / mL. A negative result does not preclude SARS-CoV-2 infection  and should not be used as the sole basis for treatment or other  patient management decisions.  A negative result may occur with  improper specimen collection / handling, submission of specimen other  than nasopharyngeal swab, presence of viral mutation(s) within the  areas targeted by this assay, and inadequate number of viral copies  (<250 copies / mL). A negative result must be combined with clinical  observations, patient history, and epidemiological information. If result is POSITIVE SARS-CoV-2 target nucleic acids are DETECTED. The SARS-CoV-2 RNA is generally detectable in upper and lower  respiratory specimens dur ing the acute phase of infection.  Positive  results are indicative of active infection with  SARS-CoV-2.  Clinical  correlation with patient history and other diagnostic information is  necessary to determine patient infection status.  Positive results do  not rule out bacterial infection or co-infection with other viruses. If result is PRESUMPTIVE POSTIVE SARS-CoV-2 nucleic acids MAY BE PRESENT.   A presumptive positive result was obtained on the submitted specimen  and confirmed on repeat testing.  While 2019 novel coronavirus  (SARS-CoV-2) nucleic acids may be present in the submitted sample  additional confirmatory testing may be necessary for epidemiological  and /  or clinical management purposes  to differentiate between  SARS-CoV-2 and other Sarbecovirus currently known to infect humans.  If clinically indicated additional testing with an alternate test  methodology [phone]) is advised. The SARS-CoV-2 RNA is generally  detectable in upper and lower respiratory sp ecimens during the acute  phase of infection. The expected result is Negative. Fact Sheet for Patients:  StrictlyIdeas.no Fact Sheet for Healthcare Providers: BankingDealers.co.za This test is not yet approved or cleared by the [place] FDA and has been authorized for detection and/or diagnosis of SARS-CoV-2 by FDA under an Emergency Use Authorization (EUA).  This EUA will remain in effect (meaning this test can be used) for the duration of the COVID-19 declaration under Section 564(b)(1) of the Act, 21 U.S.C. section 360bbb-3(b)(1), unless the authorization is terminated or revoked sooner. Performed at [facility], [place] [address], [place], [place] [address]    Ct Abdomen Pelvis Wo Contrast  Result Date: 07/11/2019 CLINICAL DATA:  Right lower quadrant pain.  Suspect appendicitis. EXAM: CT ABDOMEN AND PELVIS WITHOUT CONTRAST TECHNIQUE: Multidetector CT imaging of the abdomen and pelvis was performed following the standard protocol without  IV contrast. COMPARISON:  None FINDINGS: Lower chest: No acute abnormality. Hepatobiliary: No focal liver abnormality is seen. No gallstones, gallbladder wall thickening, or biliary dilatation. Pancreas: Unremarkable. No pancreatic ductal dilatation or surrounding inflammatory changes. Spleen: Normal in size without focal abnormality. Adrenals/Urinary Tract: Normal appearance of the adrenal glands. Cyst within upper pole of right kidney measures 1.9 cm and is incompletely characterized without IV contrast. No kidney stones or hydronephrosis. The urinary bladder appears normal. Stomach/Bowel: There is mild distension of the stomach. Proximal and mid small bowel loops are abnormally dilated with air-fluid levels consistent with a bowel obstruction. The transition point is in the right inguinal canal, image 80/2, which contains herniated loops of small bowel the distal small bowel loops and colon are decreased in caliber. The appendix is visualized and is normal in caliber, without inflammation. Vascular/Lymphatic: No significant vascular findings are present. No enlarged abdominal or pelvic lymph nodes. Reproductive: Prostate gland is not well visualized and is presumably surgically absent. Other: No free fluid or fluid collections. Musculoskeletal: No acute abnormality. Marked degenerative disc disease noted at L4-5 and L5-S1. IMPRESSION: 1. Small bowel obstruction secondary to herniated small bowel loops within the right inguinal canal. 2. Normal appendix. 3. Lumbar degenerative disc disease. Choose 1 Electronically Signed   By: Kerby Moors M.D.   On: 07/11/2019 12:58    Review of Systems  Constitutional: Negative for chills, diaphoresis and fever.  HENT: Negative.   Eyes: Negative.   Respiratory: Negative.   Cardiovascular: Negative.   Gastrointestinal: Positive for abdominal pain, nausea and vomiting.  Genitourinary: Negative.   Musculoskeletal: Negative.   Skin: Negative.   Neurological: Negative.    Endo/Heme/Allergies: Negative.   Psychiatric/Behavioral: Negative.     Blood pressure (!) 144/92, pulse (!) 105, temperature 97.8 F (36.6 C), temperature source Oral, resp. rate 17, weight 78 kg, SpO2 94 %. Physical Exam  Constitutional: He is oriented to person, place, and time. He appears well-developed and well-nourished. He appears distressed.  HENT:  Head: Normocephalic and atraumatic.  Right Ear: External ear normal.  Left Ear: External ear normal.  Eyes: Pupils are equal, round, and reactive to light. Conjunctivae are normal. No scleral icterus.  Neck: Normal range of motion. Neck supple. No tracheal deviation present. No thyromegaly present.  Cardiovascular: Regular rhythm and normal heart sounds.  No murmur  heard. Respiratory: Effort normal and breath sounds normal. No respiratory distress. He has no wheezes.  GI: He exhibits distension. He exhibits no mass. There is abdominal tenderness. There is no rebound and no guarding.  Healing surgical wounds.  No sign of infection.  Genitourinary:    Genitourinary Comments: Visible bulge right groin, firm, tender, consistent with incarcerated hernia   Musculoskeletal: Normal range of motion.        General: No deformity or edema.  Lymphadenopathy:    He has no cervical adenopathy.  Neurological: He is alert and oriented to person, place, and time.  Skin: Skin is warm and dry. He is not diaphoretic.  Psychiatric: He has a normal mood and affect. His behavior is normal.     Assessment/Plan Incarcerated right inguinal hernia with small bowel obstruction, rule out ischemia or infarction  Admit to surgical service  To OR urgently for exploration of hernia, possible laparotomy and small bowel resection  Discussed with patient and his wife at bedside.  Discussed possible hernia repair versus need for bowel resection.  Will proceed as soon as OR available.  The risks and benefits of the procedure have been discussed at length with  the patient.  The patient understands the proposed procedure, potential alternative treatments, and the course of recovery to be expected.  All of the patient's questions have been answered at this time.  The patient wishes to proceed with surgery.  [name], [facility] Office: [phone]    [name], MD 07/11/2019, 2:46 PM

## 2019-07-11 NOTE — ED Provider Notes (Signed)
[facility] DEPT Provider Note   CSN: [id number] Arrival date & time: 07/11/19  [id number]     History   Chief Complaint Chief Complaint  Patient presents with   Abdominal Pain    HPI Jared [name] is a 53 y.o. male.     Patient with history of alcohol use, history of upper GI bleeding, recent surgery in June 2020 at [facility] for prostate cancer --presents with complaint of abdominal pain and vomiting.  Patient states that he had an intermittent not in his right lower quadrant.  He states that sometimes he could feel it and other times he could not.  He states that last night it feels like it "ruptured".  He developed persistent vomiting and 7 out of 10 pain in the right lower quadrant.  He does not feel any not there at the current time.  He denies any chest pain or shortness of breath.  He has had no recent bowel movements, diarrhea, blood in the stool.  He states he has been passing a little bit of gas.  No urinary symptoms.  No treatments prior to arrival.  No other abdominal surgeries.     Past Medical History:  Diagnosis Date   Alcohol abuse    Arthritis    Depression    Gout    Hypertension 1984   PTSD (post-traumatic stress disorder)    military; retired Corporate treasurer    Patient Active Problem List   Diagnosis Date Noted   Acute blood loss anemia 09/29/2015   Alcohol abuse 09/29/2015   Hypokalemia 09/29/2015   GI bleed 09/28/2015   UGIB (upper gastrointestinal bleed)     Past Surgical History:  Procedure Laterality Date   ANKLE ARTHROSCOPY  2005.2006   bilateral   BUNIONECTOMY  1994   rt   foot   ESOPHAGOGASTRODUODENOSCOPY N/A 09/28/2015   Procedure: ESOPHAGOGASTRODUODENOSCOPY (EGD);  Surgeon: Wilford Corner, MD;  Location: [facility];  Service: Endoscopy;  Laterality: N/A;   KNEE ARTHROSCOPY  [id number]   bilateral   LUMBAR LAMINECTOMY/DECOMPRESSION MICRODISCECTOMY  09/29/2012   Procedure: LUMBAR  LAMINECTOMY/DECOMPRESSION MICRODISCECTOMY 2 LEVELS;  Surgeon: Kristeen Miss, MD;  Location: [facility];  Service: Neurosurgery;  Laterality: Left;  Left Lumbar four-five, Lumbar five-Sacral one Microdiskectomy   TONSILLECTOMY          Home Medications    Prior to Admission medications   Medication Sig Start Date End Date Taking? Authorizing Provider  allopurinol (ZYLOPRIM) 300 MG tablet Take 300 mg by mouth daily.   Yes [provider]  cholecalciferol (VITAMIN D) 1000 UNITS tablet Take 1,000 Units by mouth daily.   Yes [provider]  ferrous sulfate 324 (65 FE) MG TBEC Take 1 tablet (325 mg total) by mouth daily. 10/01/15  Yes Verlee Monte, MD  lisinopril (PRINIVIL,ZESTRIL) 20 MG tablet Take 20 mg by mouth daily.   Yes [provider]  pantoprazole (PROTONIX) 40 MG tablet Take 1 tablet (40 mg total) by mouth daily. 10/01/15  Yes Verlee Monte, MD    Family History No family history on file.  Social History Social History   Tobacco Use   Smoking status: Never Smoker   Smokeless tobacco: Never Used  Substance Use Topics   Alcohol use: Yes    Alcohol/week: 6.0 standard drinks    Types: 6 Cans of beer per week    Comment: socially   Drug use: Yes    Types: Marijuana     Allergies   Patient has no known  allergies.   Review of Systems Review of Systems  Constitutional: Negative for fever.  HENT: Negative for rhinorrhea and sore throat.   Eyes: Negative for redness.  Respiratory: Negative for cough.   Cardiovascular: Negative for chest pain.  Gastrointestinal: Positive for abdominal pain, nausea and vomiting. Negative for blood in stool and diarrhea.  Genitourinary: Negative for dysuria.  Musculoskeletal: Negative for myalgias.  Skin: Negative for rash.  Neurological: Negative for headaches.     Physical Exam Updated Vital Signs BP (!) 129/93 (BP Location: Right Arm)    Pulse (!) 125    Temp 97.8 F (36.6 C) (Oral)    Resp 16     Wt 78 kg    SpO2 100%    BMI 23.99 kg/m   Physical Exam Vitals signs and nursing note reviewed.  Constitutional:      Appearance: He is well-developed.  HENT:     Head: Normocephalic and atraumatic.  Eyes:     General:        Right eye: No discharge.        Left eye: No discharge.     Conjunctiva/sclera: Conjunctivae normal.  Neck:     Musculoskeletal: Normal range of motion and neck supple.  Cardiovascular:     Rate and Rhythm: Normal rate and regular rhythm.     Heart sounds: Normal heart sounds.  Pulmonary:     Effort: Pulmonary effort is normal.     Breath sounds: Normal breath sounds.  Abdominal:     Palpations: Abdomen is soft.     Tenderness: There is abdominal tenderness in the right lower quadrant, periumbilical area and suprapubic area. There is no guarding or rebound.     Hernia: A hernia is present. Hernia is present in the ventral area.     Comments: Patient with moderate tenderness in the right lower quadrant and groin.  I do not feel any incarcerated hernias.  Patient did have a small bulge on the right lateral abdomen at the site of 1 of the port holes.  This was easily reducible.  No pain in this area.  Several well-healed surgical scars noted.  No significant abdominal distention.  Skin:    General: Skin is warm and dry.  Neurological:     Mental Status: He is alert.      ED Treatments / Results  Labs (all labs ordered are listed, but only abnormal results are displayed) Labs Reviewed  COMPREHENSIVE METABOLIC PANEL - Abnormal; Notable for the following components:      Result Value   CO2 18 (*)    Glucose, Bld 213 (*)    Creatinine, Ser 2.28 (*)    Calcium 10.8 (*)    Total Protein 9.9 (*)    Albumin 5.4 (*)    AST 54 (*)    ALT 52 (*)    GFR calc non Af Amer 32 (*)    GFR calc Af Amer 37 (*)    Anion gap 21 (*)    All other components within normal limits  CBC WITH DIFFERENTIAL/PLATELET - Abnormal; Notable for the following components:   WBC  16.5 (*)    RBC 6.25 (*)    Hemoglobin 18.3 (*)    HCT 55.6 (*)    RDW 15.7 (*)    Neutro Abs 14.6 (*)    Abs Immature Granulocytes 0.13 (*)    All other components within normal limits  LACTIC ACID, PLASMA - Abnormal; Notable for the following components:   Lactic  Acid, Venous 5.8 (*)    All other components within normal limits  SARS CORONAVIRUS 2 (HOSPITAL ORDER, [facility])  CULTURE, BLOOD (ROUTINE X 2)  CULTURE, BLOOD (ROUTINE X 2)  LIPASE, BLOOD  URINALYSIS, ROUTINE W REFLEX MICROSCOPIC    EKG None  Radiology Ct Abdomen Pelvis Wo Contrast  Result Date: 07/11/2019 CLINICAL DATA:  Right lower quadrant pain.  Suspect appendicitis. EXAM: CT ABDOMEN AND PELVIS WITHOUT CONTRAST TECHNIQUE: Multidetector CT imaging of the abdomen and pelvis was performed following the standard protocol without IV contrast. COMPARISON:  None FINDINGS: Lower chest: No acute abnormality. Hepatobiliary: No focal liver abnormality is seen. No gallstones, gallbladder wall thickening, or biliary dilatation. Pancreas: Unremarkable. No pancreatic ductal dilatation or surrounding inflammatory changes. Spleen: Normal in size without focal abnormality. Adrenals/Urinary Tract: Normal appearance of the adrenal glands. Cyst within upper pole of right kidney measures 1.9 cm and is incompletely characterized without IV contrast. No kidney stones or hydronephrosis. The urinary bladder appears normal. Stomach/Bowel: There is mild distension of the stomach. Proximal and mid small bowel loops are abnormally dilated with air-fluid levels consistent with a bowel obstruction. The transition point is in the right inguinal canal, image 80/2, which contains herniated loops of small bowel the distal small bowel loops and colon are decreased in caliber. The appendix is visualized and is normal in caliber, without inflammation. Vascular/Lymphatic: No significant vascular findings are present. No enlarged abdominal  or pelvic lymph nodes. Reproductive: Prostate gland is not well visualized and is presumably surgically absent. Other: No free fluid or fluid collections. Musculoskeletal: No acute abnormality. Marked degenerative disc disease noted at L4-5 and L5-S1. IMPRESSION: 1. Small bowel obstruction secondary to herniated small bowel loops within the right inguinal canal. 2. Normal appendix. 3. Lumbar degenerative disc disease. Choose 1 Electronically Signed   By: Kerby Moors M.D.   On: 07/11/2019 12:58    Procedures Procedures (including critical care time)  Medications Ordered in ED Medications  HYDROmorphone (DILAUDID) injection 0.5 mg (0.5 mg Intravenous Given 07/11/19 1147)  ondansetron (ZOFRAN) injection 4 mg (4 mg Intravenous Given 07/11/19 1108)  sodium chloride 0.9 % bolus 1,000 mL (0 mLs Intravenous Stopped 07/11/19 1213)  sodium chloride 0.9 % bolus 1,000 mL (0 mLs Intravenous Stopped 07/11/19 1252)  sodium chloride 0.9 % bolus 1,000 mL (0 mLs Intravenous Stopped 07/11/19 1433)  piperacillin-tazobactam (ZOSYN) IVPB 3.375 g (0 g Intravenous Stopped 07/11/19 1257)     Initial Impression / Assessment and Plan / ED Course  I have reviewed the triage vital signs and the nursing notes.  Pertinent labs & imaging results that were available during my care of the patient were reviewed by me and considered in my medical decision making (see chart for details).        Patient seen and examined.  Patient is uncomfortable.  Will treat pain.  Patient is tachycardic to the 120s.  Will give fluid bolus.  Patient will need CT imaging of the abdomen and pelvis given his right lower quadrant pain is surgery.  Will need to evaluate for infection, obstruction.  Vital signs reviewed and are as follows: BP (!) 129/93 (BP Location: Right Arm)    Pulse (!) 125    Temp 97.8 F (36.6 C) (Oral)    Resp 16    Wt 78 kg    SpO2 100%    BMI 23.99 kg/m   12:19 PM acute injury and critical lactate noted.  Additional fluids  and antibiotics ordered.  Blood cultures ordered.  Patient discussed with Dr. Eulis Foster who will see.  Patient reexamined.  Wife now at bedside.  Updated.  Patient is comfortable after pain medication.  12:41 PM Went to recheck patient, he is in CT.   1:13 PM CT reviewed. Pt with SBO 2/2 hernia. I went back and re-evaluated patient. He tolerates me pushing in R in right inguinal area.  I can feel a defect but he does not have a firm bulge.   1:17 PM Spoke with Dr. Harlow Asa who is aware and will see patient. Agrees with abx, fluids, NPO.   CRITICAL CARE Performed by: Carlisle Cater Total critical care time: 40 minutes Critical care time was exclusive of separately billable procedures and treating other patients. Critical care was necessary to treat or prevent imminent or life-threatening deterioration. Critical care was time spent personally by me on the following activities: development of treatment plan with patient and/or surrogate as well as nursing, discussions with consultants, evaluation of patient's response to treatment, examination of patient, obtaining history from patient or surrogate, ordering and performing treatments and interventions, ordering and review of laboratory studies, ordering and review of radiographic studies, pulse oximetry and re-evaluation of patient's condition.   Final Clinical Impressions(s) / ED Diagnoses   Final diagnoses:  Strangulated inguinal hernia  Acute kidney injury (HCC)  Lactic acidosis   Admit to surgery.   ED Discharge Orders    None       Carlisle Cater, Hershal Coria 07/11/19 1450    Daleen Bo, MD 07/13/19 [phone]

## 2019-07-11 NOTE — Anesthesia Procedure Notes (Signed)
Procedure Name: Intubation Date/Time: 07/11/2019 3:59 PM Performed by: Anne Fu, CRNA Pre-anesthesia Checklist: Patient identified, Emergency Drugs available, Suction available, Patient being monitored and Timeout performed Patient Re-evaluated:Patient Re-evaluated prior to induction Oxygen Delivery Method: Circle system utilized Preoxygenation: Pre-oxygenation with 100% oxygen Induction Type: IV induction, Cricoid Pressure applied and Rapid sequence Laryngoscope Size: Mac and 4 Grade View: Grade I Tube type: Oral Tube size: 7.5 mm Number of attempts: 1 Airway Equipment and Method: Stylet Placement Confirmation: ETT inserted through vocal cords under direct vision,  positive ETCO2 and breath sounds checked- equal and bilateral Secured at: 22 cm Tube secured with: Tape Dental Injury: Teeth and Oropharynx as per pre-operative assessment

## 2019-07-11 NOTE — ED Notes (Signed)
Dilaudid deferred at this time d/t somnolence.

## 2019-07-11 NOTE — ED Notes (Signed)
O.R. staff are here to take pt. To O.R. His wife takes all of his belongings and clothing with her. Pt. Remains in no distress.

## 2019-07-11 NOTE — ED Provider Notes (Signed)
  Face-to-face evaluation   History: He presents for evaluation of vomiting and right groin pain with swelling and concern for hernia.  No blood in emesis.  No known trauma or lifting.  Physical exam: Calm, cooperative.  Right groin has diffuse swelling, and tenderness.  No distinctly palpable hernia.  Medical screening examination/treatment/procedure(s) were conducted as a shared visit with non-physician practitioner(s) and myself.  I personally evaluated the patient during the encounter    Daleen Bo, MD 07/13/19 [phone]

## 2019-07-11 NOTE — Transfer of Care (Signed)
Immediate Anesthesia Transfer of Care Note  Patient: Jared [name]  Procedure(s) Performed: Procedure(s): HERNIA REPAIR INGUINAL ADULT with mesh (Right)  Patient Location: PACU  Anesthesia Type:General  Level of Consciousness:  sedated, patient cooperative and responds to stimulation  Airway & Oxygen Therapy:Patient Spontanous Breathing and Patient connected to face mask oxgen  Post-op Assessment:  Report given to PACU RN and Post -op Vital signs reviewed and stable  Post vital signs:  Reviewed and stable  Last Vitals:  Vitals:   07/11/19 1400 07/11/19 1430  BP: (!) 151/93 (!) 157/99  Pulse: 90 92  Resp:    Temp:    SpO2: [id number]    Complications: No apparent anesthesia complications'

## 2019-07-11 NOTE — Anesthesia Postprocedure Evaluation (Signed)
Anesthesia Post Note  Patient: Jared [name]  Procedure(s) Performed: HERNIA REPAIR INGUINAL ADULT with mesh (Right Inguinal)     Patient location during evaluation: PACU Anesthesia Type: General Level of consciousness: awake and alert Pain management: pain level controlled Vital Signs Assessment: post-procedure vital signs reviewed and stable Respiratory status: spontaneous breathing, nonlabored ventilation, respiratory function stable and patient connected to nasal cannula oxygen Cardiovascular status: blood pressure returned to baseline and stable Postop Assessment: no apparent nausea or vomiting Anesthetic complications: no    Last Vitals:  Vitals:   07/11/19 1800 07/11/19 1810  BP: (!) 159/101 (!) 143/102  Pulse:    Resp:  18  Temp:    SpO2: 96% 94%    Last Pain:  Vitals:   07/11/19 1810  TempSrc:   PainSc: Remerton E Banks Chaikin

## 2019-07-11 NOTE — Anesthesia Preprocedure Evaluation (Addendum)
Anesthesia Evaluation  Patient identified by MRN, date of birth, ID band Patient awake    Reviewed: Allergy & Precautions, NPO status , Patient's Chart, lab work & pertinent test results  History of Anesthesia Complications Negative for: history of anesthetic complications  Airway Mallampati: III  TM Distance: >3 FB Neck ROM: Full    Dental  (+) Dental Advisory Given, Teeth Intact   Pulmonary neg pulmonary ROS,    Pulmonary exam normal        Cardiovascular hypertension, Pt. on medications Normal cardiovascular exam     Neuro/Psych PSYCHIATRIC DISORDERS Anxiety Depression negative neurological ROS     GI/Hepatic (+)     substance abuse  alcohol use,  Incarcerated right inguinal hernia    Endo/Other  negative endocrine ROS  Renal/GU Renal InsufficiencyRenal disease     Musculoskeletal  (+) Arthritis ,  Gout    Abdominal   Peds  Hematology negative hematology ROS (+)   Anesthesia Other Findings   Reproductive/Obstetrics                            Anesthesia Physical Anesthesia Plan  ASA: II and emergent  Anesthesia Plan: General   Post-op Pain Management:    Induction: Intravenous and Rapid sequence  PONV Risk Score and Plan: 4 or greater and Treatment may vary due to age or medical condition, Ondansetron, Dexamethasone and Midazolam  Airway Management Planned: Oral ETT  Additional Equipment: None  Intra-op Plan:   Post-operative Plan: Extubation in OR  Informed Consent: I have reviewed the patients History and Physical, chart, labs and discussed the procedure including the risks, benefits and alternatives for the proposed anesthesia with the patient or authorized representative who has indicated his/her understanding and acceptance.     Dental advisory given  Plan Discussed with: CRNA and Anesthesiologist  Anesthesia Plan Comments:        Anesthesia Quick  Evaluation

## 2019-07-11 NOTE — ED Triage Notes (Signed)
Pt is concerned for a ruptured abd hernia x 1 week, worsening last night. Emesis started last night. Pt states he had a knot in his RLQ. Pt states he had prostate CA in June and was told that would happen.

## 2019-07-11 NOTE — ED Notes (Signed)
Date and time results received: 07/11/19 12:05 PM (use smartphrase ".now" to insert current time)  Test: Lactic Critical Value: 5.8  Name of Provider Notified: Geiple  Orders Received? Or Actions Taken?: Actions Taken: Notified Arts development officer and [place], [place]

## 2019-07-12 LAB — CBC
HCT: 38.3 % — ABNORMAL LOW (ref 39.0–52.0)
Hemoglobin: 12.1 g/dL — ABNORMAL LOW (ref 13.0–17.0)
MCH: 29.3 pg (ref 26.0–34.0)
MCHC: 31.6 g/dL (ref 30.0–36.0)
MCV: 92.7 fL (ref 80.0–100.0)
Platelets: 221 10*3/uL (ref 150–400)
RBC: 4.13 MIL/uL — ABNORMAL LOW (ref 4.22–5.81)
RDW: 15.6 % — ABNORMAL HIGH (ref 11.5–15.5)
WBC: 7.2 10*3/uL (ref 4.0–10.5)
nRBC: 0 % (ref 0.0–0.2)

## 2019-07-12 LAB — BASIC METABOLIC PANEL
Anion gap: 11 (ref 5–15)
BUN: 10 mg/dL (ref 6–20)
CO2: 21 mmol/L — ABNORMAL LOW (ref 22–32)
Calcium: 8.3 mg/dL — ABNORMAL LOW (ref 8.9–10.3)
Chloride: 108 mmol/L (ref 98–111)
Creatinine, Ser: 1.1 mg/dL (ref 0.61–1.24)
GFR calc Af Amer: >60 mL/min (ref >60)
GFR calc non Af Amer: >60 mL/min (ref >60)
Glucose, Bld: 147 mg/dL — ABNORMAL HIGH (ref 70–99)
Potassium: 4.1 mmol/L (ref 3.5–5.1)
Sodium: 140 mmol/L (ref 135–145)

## 2019-07-12 LAB — LACTIC ACID, PLASMA: Lactic Acid, Venous: 2.9 mmol/L (ref 0.5–1.9)

## 2019-07-12 MED ORDER — TRAMADOL HCL 50 MG PO TABS
50.0000 mg | ORAL_TABLET | Freq: Four times a day (QID) | ORAL | Status: DC | PRN
  Administered 2019-07-12: 50 mg via ORAL
  Filled 2019-07-12: qty 1

## 2019-07-12 MED ORDER — ENOXAPARIN SODIUM 40 MG/0.4ML ~~LOC~~ SOLN
40.0000 mg | SUBCUTANEOUS | Status: DC
  Administered 2019-07-13: 40 mg via SUBCUTANEOUS
  Filled 2019-07-12: qty 0.4

## 2019-07-12 NOTE — Progress Notes (Signed)
Assessment & Plan: POD#1 - status post open repair incarcerated RIH with mesh  Much improved this AM - well resuscitated by anesthesia intra-operatively  Labs normalizing  Hemodynamically stable - will transfer to floor this AM  Begin clear liquid diet  OOB, ambulate        [name], MD       [facility]       Office: [phone]   Chief Complaint: Small bowel obstruction due to incarcerated RIH  Subjective: Patient more alert, responsive.  Mild pain.  Feels better.  Objective: Vital signs in last 24 hours: Temp:  [97.8 F (36.6 C)-98.8 F (37.1 C)] 98.8 F (37.1 C) (09/07 0323) Pulse Rate:  [76-125] 76 (09/07 0600) Resp:  [10-19] 10 (09/07 0600) BP: (101-179)/(68-113) 119/68 (09/07 0600) SpO2:  [94 %-100 %] 97 % (09/07 0600) Weight:  [78 kg] 78 kg (09/06 0935) Last BM Date: 07/10/19  Intake/Output from previous day: 09/06 0701 - 09/07 0700 In: 7901.8 [I.V.:7551.8; IV Piggyback:350] Out: 2685 [Urine:2275; Blood:10] Intake/Output this shift: No intake/output data recorded.  Physical Exam: HEENT - sclerae clear, mucous membranes moist Neck - soft Chest - clear bilaterally Cor - RRR Abdomen - softer, mild distension; right inguinal incision dry and intact with Dermabond Ext - no edema, non-tender Neuro - alert & oriented, no focal deficits  Lab Results:  Recent Labs    07/11/19 1054 07/12/19 0216  WBC 16.5* 7.2  HGB 18.3* 12.1*  HCT 55.6* 38.3*  PLT 339 221   BMET Recent Labs    07/11/19 1054 07/12/19 0216  NA 139 140  K 3.8 4.1  CL 100 108  CO2 18* 21*  GLUCOSE 213* 147*  BUN 17 10  CREATININE 2.28* 1.10  CALCIUM 10.8* 8.3*   PT/INR No results for input(s): LABPROT, INR in the last 72 hours. Comprehensive Metabolic Panel:    Component Value Date/Time   NA 140 07/12/2019 0216   NA 139 07/11/2019 1054   K 4.1 07/12/2019 0216   K 3.8 07/11/2019 1054   CL 108 07/12/2019 0216   CL 100 07/11/2019 1054   CO2 21  (L) 07/12/2019 0216   CO2 18 (L) 07/11/2019 1054   BUN 10 07/12/2019 0216   BUN 17 07/11/2019 1054   CREATININE 1.10 07/12/2019 0216   CREATININE 2.28 (H) 07/11/2019 1054   GLUCOSE 147 (H) 07/12/2019 0216   GLUCOSE 213 (H) 07/11/2019 1054   CALCIUM 8.3 (L) 07/12/2019 0216   CALCIUM 10.8 (H) 07/11/2019 1054   AST 54 (H) 07/11/2019 1054   AST 22 09/28/2015 0851   ALT 52 (H) 07/11/2019 1054   ALT 42 09/28/2015 0851   ALKPHOS 110 07/11/2019 1054   ALKPHOS 55 09/28/2015 0851   BILITOT 0.9 07/11/2019 1054   BILITOT 0.8 09/28/2015 0851   PROT 9.9 (H) 07/11/2019 1054   PROT 5.3 (L) 09/28/2015 0851   ALBUMIN 5.4 (H) 07/11/2019 1054   ALBUMIN 3.0 (L) 09/28/2015 0851    Studies/Results: Ct Abdomen Pelvis Wo Contrast  Result Date: 07/11/2019 CLINICAL DATA:  Right lower quadrant pain.  Suspect appendicitis. EXAM: CT ABDOMEN AND PELVIS WITHOUT CONTRAST TECHNIQUE: Multidetector CT imaging of the abdomen and pelvis was performed following the standard protocol without IV contrast. COMPARISON:  None FINDINGS: Lower chest: No acute abnormality. Hepatobiliary: No focal liver abnormality is seen. No gallstones, gallbladder wall thickening, or biliary dilatation. Pancreas: Unremarkable. No pancreatic ductal dilatation or surrounding inflammatory changes. Spleen: Normal in size without focal abnormality.  Adrenals/Urinary Tract: Normal appearance of the adrenal glands. Cyst within upper pole of right kidney measures 1.9 cm and is incompletely characterized without IV contrast. No kidney stones or hydronephrosis. The urinary bladder appears normal. Stomach/Bowel: There is mild distension of the stomach. Proximal and mid small bowel loops are abnormally dilated with air-fluid levels consistent with a bowel obstruction. The transition point is in the right inguinal canal, image 80/2, which contains herniated loops of small bowel the distal small bowel loops and colon are decreased in caliber. The appendix is  visualized and is normal in caliber, without inflammation. Vascular/Lymphatic: No significant vascular findings are present. No enlarged abdominal or pelvic lymph nodes. Reproductive: Prostate gland is not well visualized and is presumably surgically absent. Other: No free fluid or fluid collections. Musculoskeletal: No acute abnormality. Marked degenerative disc disease noted at L4-5 and L5-S1. IMPRESSION: 1. Small bowel obstruction secondary to herniated small bowel loops within the right inguinal canal. 2. Normal appendix. 3. Lumbar degenerative disc disease. Choose 1 Electronically Signed   By: Kerby Moors M.D.   On: 07/11/2019 12:58      [name] 07/12/2019  Patient ID: Jared [name], male   DOB: [birth year], 53 y.o.   MRN: [id number]

## 2019-07-13 ENCOUNTER — Encounter (HOSPITAL_COMMUNITY): Payer: Self-pay | Admitting: Surgery

## 2019-07-13 MED ORDER — ACETAMINOPHEN 325 MG PO TABS: 650.0000 mg | ORAL_TABLET | Freq: Four times a day (QID) | ORAL | Status: AC | PRN

## 2019-07-13 MED ORDER — TRAMADOL HCL 50 MG PO TABS: 50.0000 mg | ORAL_TABLET | Freq: Four times a day (QID) | ORAL | 0 refills | Status: DC | PRN

## 2019-07-13 NOTE — Discharge Instructions (Signed)
[facility] _______[facility] °INGUINAL HERNIA REPAIR: POST OP INSTRUCTIONS ° °Always review your discharge instruction sheet given to you by the facility where your surgery was performed. °IF YOU HAVE DISABILITY OR FAMILY LEAVE FORMS, YOU MUST BRING THEM TO THE OFFICE FOR PROCESSING.   °DO NOT GIVE THEM TO YOUR DOCTOR. ° °1. A  prescription for pain medication may be given to you upon discharge.  Take your pain medication as prescribed, if needed.  If narcotic pain medicine is not needed, then you may take acetaminophen (Tylenol) or ibuprofen (Advil) as needed. °2. Take your usually prescribed medications unless otherwise directed. °If you need a refill on your pain medication, please contact your pharmacy.  They will contact our office to request authorization. Prescriptions will not be filled after 5 pm or on week-ends. °3. You should follow a light diet the first 24 hours after arrival home, such as soup and crackers, etc.  Be sure to include lots of fluids daily.  Resume your normal diet the day after surgery. °4.Most patients will experience some swelling and bruising around the umbilicus or in the groin and scrotum.  Ice packs and reclining will help.  Swelling and bruising can take several days to resolve.  °6. It is common to experience some constipation if taking pain medication after surgery.  Increasing fluid intake and taking a stool softener (such as Colace) will usually help or prevent this problem from occurring.  A mild laxative (Milk of Magnesia or Miralax) should be taken according to package directions if there are no bowel movements after 48 hours. °7. Unless discharge instructions indicate otherwise, you may remove your bandages 24-48 hours after surgery, and you may shower at that time.  You may have steri-strips (small skin tapes) in place directly over the incision.  These strips should be left on the skin for 7-10 days.  If your surgeon used skin glue on the incision, you may  shower in 24 hours.  The glue will flake off over the next 2-3 weeks.  Any sutures or staples will be removed at the office during your follow-up visit. °8. ACTIVITIES:  You may resume regular (light) daily activities beginning the next day--such as daily self-care, walking, climbing stairs--gradually increasing activities as tolerated.  You may have sexual intercourse when it is comfortable.  Refrain from any heavy lifting or straining until approved by your doctor. ° °a.You may drive when you are no longer taking prescription pain medication, you can comfortably wear a seatbelt, and you can safely maneuver your car and apply brakes. °b.RETURN TO WORK:   °_____________________________________________ ° °9.You should see your doctor in the office for a follow-up appointment approximately 2-3 weeks after your surgery.  Make sure that you call for this appointment within a day or two after you arrive home to insure a convenient appointment time. °10.OTHER INSTRUCTIONS: _________________________ °   _____________________________________ ° °WHEN TO CALL YOUR DOCTOR: °1. Fever over 101.0 °2. Inability to urinate °3. Nausea and/or vomiting °4. Extreme swelling or bruising °5. Continued bleeding from incision. °6. Increased pain, redness, or drainage from the incision ° °The clinic staff is available to answer your questions during regular business hours.  Please don’t hesitate to call and ask to speak to one of the nurses for clinical concerns.  If you have a medical emergency, go to the nearest emergency room or call 911.  A surgeon from [facility] is always on call at the hospital ° ° °[address], [address], [place], [place]  [address] ? ° [address] [address], , [place]   [address] °[phone] ? [phone] ? FAX [phone] °Web site: [email or web address] ° °

## 2019-07-13 NOTE — Discharge Summary (Signed)
[facility] Discharge Summary   Patient ID: Jared [name] MRN: [id number] DOB/AGE: Aug 24, 1966 53 y.o.  Admit date: 07/11/2019 Discharge date: 07/13/2019  Admitting Diagnosis: Incarcerated right inguinal hernia with small bowel obstruction  Discharge Diagnosis Patient Active Problem List   Diagnosis Date Noted  . Incarcerated right inguinal hernia 07/11/2019  . Small bowel obstruction ([place]) 07/11/2019  . Acute blood loss anemia 09/29/2015  . Alcohol abuse 09/29/2015  . Hypokalemia 09/29/2015  . GI bleed 09/28/2015  . UGIB (upper gastrointestinal bleed)     Consultants None  Imaging: Ct Abdomen Pelvis Wo Contrast  Result Date: 07/11/2019 CLINICAL DATA:  Right lower quadrant pain.  Suspect appendicitis. EXAM: CT ABDOMEN AND PELVIS WITHOUT CONTRAST TECHNIQUE: Multidetector CT imaging of the abdomen and pelvis was performed following the standard protocol without IV contrast. COMPARISON:  None FINDINGS: Lower chest: No acute abnormality. Hepatobiliary: No focal liver abnormality is seen. No gallstones, gallbladder wall thickening, or biliary dilatation. Pancreas: Unremarkable. No pancreatic ductal dilatation or surrounding inflammatory changes. Spleen: Normal in size without focal abnormality. Adrenals/Urinary Tract: Normal appearance of the adrenal glands. Cyst within upper pole of right kidney measures 1.9 cm and is incompletely characterized without IV contrast. No kidney stones or hydronephrosis. The urinary bladder appears normal. Stomach/Bowel: There is mild distension of the stomach. Proximal and mid small bowel loops are abnormally dilated with air-fluid levels consistent with a bowel obstruction. The transition point is in the right inguinal canal, image 80/2, which contains herniated loops of small bowel the distal small bowel loops and colon are decreased in caliber. The appendix is visualized and is normal in caliber, without inflammation. Vascular/Lymphatic: No  significant vascular findings are present. No enlarged abdominal or pelvic lymph nodes. Reproductive: Prostate gland is not well visualized and is presumably surgically absent. Other: No free fluid or fluid collections. Musculoskeletal: No acute abnormality. Marked degenerative disc disease noted at L4-5 and L5-S1. IMPRESSION: 1. Small bowel obstruction secondary to herniated small bowel loops within the right inguinal canal. 2. Normal appendix. 3. Lumbar degenerative disc disease. Choose 1 Electronically Signed   By: Kerby Moors M.D.   On: 07/11/2019 12:58    Procedures Dr. Harlow Asa (07/11/19) - Open repair inguinal hernia  Hospital Course:  Jared [name] is a 53yo male PMH prostate resection for cancer at [facility] on 06/14/2019, who presented to [facility] 9/6 with 1 week history of abdominal pain and n/v.  Workup showed Incarcerated right inguinal hernia with small bowel obstruction.  Patient was admitted and underwent procedure listed above.  Tolerated procedure well and was transferred to the floor.  Diet was advanced as tolerated.  On POD2 the patient was voiding well, tolerating diet, having bowel function, ambulating well, pain well controlled, vital signs stable, incisions c/d/i and felt stable for discharge home.  Patient will follow up as below and knows to call with questions or concerns.    I have personally reviewed the patients medication history on the [place] controlled substance database.    Physical Exam: General:  Alert, NAD, pleasant, comfortable Pulm: rate and effort normal Abd:  Soft, ND, NT, +BS, right groin incision cdi without erythema or drainage  Allergies as of 07/13/2019   No Known Allergies     Medication List    TAKE these medications   acetaminophen 325 MG tablet Commonly known as: TYLENOL Take 2 tablets (650 mg total) by mouth every 6 (six) hours as needed for mild pain (or Fever >/= 101).   allopurinol 300 MG tablet Commonly  known as: ZYLOPRIM Take 300 mg by  mouth daily.   cholecalciferol 1000 units tablet Commonly known as: VITAMIN D Take 1,000 Units by mouth daily.   ferrous sulfate 324 (65 Fe) MG Tbec Take 1 tablet (325 mg total) by mouth daily.   lisinopril 20 MG tablet Commonly known as: ZESTRIL Take 20 mg by mouth daily.   pantoprazole 40 MG tablet Commonly known as: Protonix Take 1 tablet (40 mg total) by mouth daily.   traMADol 50 MG tablet Commonly known as: ULTRAM Take 1 tablet (50 mg total) by mouth every 6 (six) hours as needed for severe pain.        Follow-up Information    [name], MD. Call.   Specialty: General Surgery Why: We are working on your appointment, please call to confirm. Please arrive 30 minutes prior to your appointment to check in and fill out paperwork. Bring photo ID and insurance information. Contact information: [address] [place] [address] [phone]           Signed: Wellington Hampshire, [facility] 07/13/2019, 11:10 AM Pager: [phone] Mon-Thurs 7:00 am-4:30 pm Fri 7:00 am -11:30 AM Sat-Sun 7:00 am-11:30 am

## 2019-07-16 LAB — CULTURE, BLOOD (ROUTINE X 2)
Culture: NO GROWTH
Culture: NO GROWTH
Special Requests: ADEQUATE
Special Requests: ADEQUATE

## 2020-06-03 DIAGNOSIS — Z20822 Contact with and (suspected) exposure to covid-19: Secondary | ICD-10-CM | POA: Diagnosis not present

## 2023-06-20 DIAGNOSIS — M5116 Intervertebral disc disorders with radiculopathy, lumbar region: Secondary | ICD-10-CM | POA: Diagnosis not present

## 2024-04-06 DIAGNOSIS — M25571 Pain in right ankle and joints of right foot: Secondary | ICD-10-CM | POA: Diagnosis not present

## 2024-04-13 DIAGNOSIS — M5116 Intervertebral disc disorders with radiculopathy, lumbar region: Secondary | ICD-10-CM | POA: Diagnosis not present

## 2024-04-14 DIAGNOSIS — M545 Low back pain, unspecified: Secondary | ICD-10-CM | POA: Diagnosis not present

## 2024-04-15 DIAGNOSIS — M48061 Spinal stenosis, lumbar region without neurogenic claudication: Secondary | ICD-10-CM | POA: Diagnosis not present

## 2024-04-15 DIAGNOSIS — M5116 Intervertebral disc disorders with radiculopathy, lumbar region: Secondary | ICD-10-CM | POA: Diagnosis not present

## 2024-04-19 DIAGNOSIS — M48061 Spinal stenosis, lumbar region without neurogenic claudication: Secondary | ICD-10-CM | POA: Diagnosis not present

## 2024-05-25 DIAGNOSIS — M48061 Spinal stenosis, lumbar region without neurogenic claudication: Secondary | ICD-10-CM | POA: Diagnosis not present

## 2024-06-30 DIAGNOSIS — M21371 Foot drop, right foot: Secondary | ICD-10-CM | POA: Diagnosis not present

## 2024-09-24 ENCOUNTER — Other Ambulatory Visit: Payer: Self-pay | Admitting: Radiation Oncology

## 2024-09-24 ENCOUNTER — Inpatient Hospital Stay
Admission: RE | Admit: 2024-09-24 | Discharge: 2024-09-24 | Disposition: A | Discharge status: Home / Self Care | Payer: Self-pay | Source: Ambulatory Visit | Attending: Radiation Oncology | Admitting: Radiation Oncology

## 2024-09-24 DIAGNOSIS — C61 Malignant neoplasm of prostate: Secondary | ICD-10-CM

## 2024-09-27 NOTE — Progress Notes (Signed)
 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 Fairy LOIS Galloway ([facility]) rising PSA   Biopsies 04/2019   09/09/2024 NM PET/CT-Skull BAS CLINICAL HISTORY:  s/p Ralp 2020, PSA now 0.19     Past/Anticipated interventions by urology, if any: NA  Past/Anticipated interventions by medical oncology, if any: NA  Weight changes, if any: No  IPSS:  18 SHIM:  18  Bowel/Bladder complaints, if any:  No  Nausea/Vomiting, if any: No  Pain issues, if any:  0/10  SAFETY ISSUES: Prior radiation? No Pacemaker/ICD? No Possible current pregnancy? Male Is the patient on methotrexate? No  Current Complaints / other details: None  30 minutes spent total, including time for meaningful use questions, reviewing medication, as well as spent in face-to-face time in nurse evaluation with the patient.

## 2024-10-04 NOTE — Progress Notes (Signed)
 Radiation Oncology         (336) [phone] ________________________________  Initial Outpatient Consultation  Name: Jared [name] MRN: [id number]  Date: 10/05/2024  DOB: 12-27-1965  RR:Cpjd, [place]akin, MD  Winthrop Fairy POUR, MD   REFERRING PHYSICIAN: Winthrop Fairy POUR, MD  DIAGNOSIS: 58 y.o. gentleman with a detectable, rising PSA of 0.22 s/p RALP 04/2019 for Stage pT2cN0, Gleason 4+3 prostate cancer    ICD-10-CM   1. 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 male with a diagnosis of biochemically recurrent prostate cancer. He was initially diagnosed with Gleason 4+3 prostate cancer on 12/22/18 with a PSA of 6.66. He opted to proceed with RALP on 04/14/19 under the care of Dr. Cena and a repeat PSA at that time remained elevated at 4.94.  Final surgical pathology revealed Gleason 4+3 prostatic adenocarcinoma with a focally positive margin on the right side and perineural invasion. There was no evidence of seminal vesicle involvement or positive lymph nodes. His postoperative PSA was initially very low detectable at 0.03 but became undetectable on repeat in 11/2019.  Since that time, his PSA remained stable through 12/2022 before beginning to rise to 0.07 in 06/2023. His PSA continued to rise, to 0.09 in 02/2024 and up to 0.19 in 07/2024. He underwent a staging PSMA PET scan on 09/09/24 showing mild increased activity at bilateral suture lines of the prostatectomy bed, nonspecific but small/early cancer recurrence in differential.  There was no evidence of definite metastatic disease.  His PSA had increased further at 0.22 on his most recent labs performed 09/21/2024.  The patient reviewed the pathology and PSA results with his urologist and was seen by Dr. Winthrop in radiation oncology at the [facility] but was interested in having the radiation treatments locally, in  [place], so he has kindly been referred today for further discussion of potential salvage radiation treatment options.   PREVIOUS RADIATION THERAPY: No  PAST MEDICAL HISTORY:  Past Medical History:  Diagnosis Date   Alcohol abuse    Arthritis    Depression    Gout    Hypertension 1984   PTSD (post-traumatic stress disorder)    military; retired electronics engineer      PAST SURGICAL HISTORY: Past Surgical History:  Procedure Laterality Date   ANKLE ARTHROSCOPY  2005.2006   bilateral   BUNIONECTOMY  1994   rt   foot   ESOPHAGOGASTRODUODENOSCOPY N/A 09/28/2015   Procedure: ESOPHAGOGASTRODUODENOSCOPY (EGD);  Surgeon: Jerrell Sol, MD;  Location: [facility];  Service: Endoscopy;  Laterality: N/A;   INGUINAL HERNIA REPAIR Right 07/11/2019   Procedure: HERNIA REPAIR INGUINAL ADULT with mesh;  Surgeon: Eletha Boas, MD;  Location: [facility];  Service: General;  Laterality: Right;   KNEE ARTHROSCOPY  [id number]   bilateral   LUMBAR LAMINECTOMY/DECOMPRESSION MICRODISCECTOMY  09/29/2012   Procedure: LUMBAR LAMINECTOMY/DECOMPRESSION MICRODISCECTOMY 2 LEVELS;  Surgeon: Victory Gens, MD;  Location: [facility];  Service: Neurosurgery;  Laterality: Left;  Left Lumbar four-five, Lumbar five-Sacral one Microdiskectomy   TONSILLECTOMY      FAMILY HISTORY: No family history on file.  SOCIAL HISTORY:  Social History   Socioeconomic History   Marital status: Married    Spouse name: Not on file   Number of children: Not on file   Years of education: Not on file   Highest education level: Not on file  Occupational  History   Not on file  Tobacco Use   Smoking status: Never   Smokeless tobacco: Never  Vaping Use   Vaping status: Never Used  Substance and Sexual Activity   Alcohol use: Yes    Alcohol/week: 6.0 standard drinks of alcohol    Types: 6 Cans of beer per week    Comment: socially   Drug use: Yes    Types: Marijuana, Cocaine   Sexual activity: Not on file  Other Topics Concern    Not on file  Social History Narrative   Not on file   Social Drivers of Health   Financial Resource Strain: Not on file  Food Insecurity: No Food Insecurity (10/05/2024)   Hunger Vital Sign    Worried About Running Out of Food in the Last Year: Never true    Ran Out of Food in the Last Year: Never true  Transportation Needs: No Transportation Needs (10/05/2024)   PRAPARE - Administrator, Civil Service (Medical): No    Lack of Transportation (Non-Medical): No  Physical Activity: Not on file  Stress: Not on file  Social Connections: Not on file  Intimate Partner Violence: Not At Risk (10/05/2024)   Humiliation, Afraid, Rape, and Kick questionnaire    Fear of Current or Ex-Partner: No    Emotionally Abused: No    Physically Abused: No    Sexually Abused: No    ALLERGIES: Patient has no known allergies.  MEDICATIONS:  Current Outpatient Medications  Medication Sig Dispense Refill   amLODipine (NORVASC) 10 MG tablet Take 5 mg by mouth daily.     sildenafil (VIAGRA) 100 MG tablet Take 100 mg by mouth as needed.     acetaminophen  (TYLENOL ) 325 MG tablet Take 2 tablets (650 mg total) by mouth every 6 (six) hours as needed for mild pain (or Fever >/= 101).     allopurinol  (ZYLOPRIM ) 300 MG tablet Take 300 mg by mouth daily.     cholecalciferol  (VITAMIN D) 1000 UNITS tablet Take 1,000 Units by mouth daily.     ferrous sulfate  324 (65 FE) MG TBEC Take 1 tablet (325 mg total) by mouth daily. 30 tablet 0   No current facility-administered medications for this encounter.    REVIEW OF SYSTEMS:  On review of systems, the patient reports that he is doing well overall. He denies any chest pain, shortness of breath, cough, fevers, chills, night sweats, unintended weight changes. He denies any bowel disturbances, and denies abdominal pain, nausea or vomiting. He denies any new musculoskeletal or joint aches or pains. His IPSS was 18, indicating moderate urinary symptoms. His SHIM was  18, indicating he has has mild postoperative erectile dysfunction. A complete review of systems is obtained and is otherwise negative.    PHYSICAL EXAM:  Wt Readings from Last 3 Encounters:  10/05/24 169 lb (76.7 kg)  07/12/19 186 lb (84.4 kg)  09/30/15 157 lb 1.6 oz (71.3 kg)   Temp Readings from Last 3 Encounters:  10/05/24 (!) 97.3 F (36.3 C) (Temporal)  07/13/19 99.2 F (37.3 C) (Oral)  10/01/15 98.5 F (36.9 C) (Oral)   BP Readings from Last 3 Encounters:  10/05/24 (!) 141/96  07/13/19 (!) 140/97  10/01/15 120/78   Pulse Readings from Last 3 Encounters:  10/05/24 66  07/13/19 81  10/01/15 76   Pain Assessment Pain Score: 0-No pain/10  In general this is a well appearing African-American man in no acute distress. He's alert and oriented x4 and appropriate  throughout the examination. Cardiopulmonary assessment is negative for acute distress, and he exhibits normal effort.     KPS = 100  100 - Normal; no complaints; no evidence of disease. 90   - Able to carry on normal activity; minor signs or symptoms of disease. 80   - Normal activity with effort; some signs or symptoms of disease. 70   - Cares for self; unable to carry on normal activity or to do active work. 60   - Requires occasional assistance, but is able to care for most of his personal needs. 50   - Requires considerable assistance and frequent medical care. 40   - Disabled; requires special care and assistance. 30   - Severely disabled; hospital admission is indicated although death not imminent. 20   - Very sick; hospital admission necessary; active supportive treatment necessary. 10   - Moribund; fatal processes progressing rapidly. 0     - Dead  Karnofsky DA, Abelmann WH, Craver LS and Burchenal [facility] [phone]) The use of the nitrogen mustards in the palliative treatment of carcinoma: with particular reference to bronchogenic carcinoma Cancer 1 634-56  LABORATORY DATA:  Lab Results  Component Value Date    WBC 7.2 07/12/2019   HGB 12.1 (L) 07/12/2019   HCT 38.3 (L) 07/12/2019   MCV 92.7 07/12/2019   PLT 221 07/12/2019   Lab Results  Component Value Date   NA 140 07/12/2019   K 4.1 07/12/2019   CL 108 07/12/2019   CO2 21 (L) 07/12/2019   Lab Results  Component Value Date   ALT 52 (H) 07/11/2019   AST 54 (H) 07/11/2019   ALKPHOS 110 07/11/2019   BILITOT 0.9 07/11/2019     RADIOGRAPHY: No results found.    IMPRESSION/PLAN: 1. 58 y.o. gentleman with a detectable, rising PSA of 0.22 s/p RALP 04/2019 for Stage pT2cN0, Gleason 4+3 prostate cancer. Today we reviewed the findings and workup thus far.  We discussed the natural history of prostate cancer.  We reviewed the the implications of positive margins, extracapsular extension, and seminal vesicle involvement on the risk of prostate cancer recurrence. In his case, only a focal positive margin was present. We reviewed some of the evidence suggesting an advantage for patients who undergo adjuvant radiotherapy in the setting in terms of disease control and overall survival. We also discussed some of the dilemmas related to the available evidence.  We discussed the SWOG trial which did show an improvement in disease-free survival as well as overall survival using adjuvant radiotherapy. However, we discussed the fact that the study did not carefully control the usage of adjuvant radiotherapy in the observation arm. There is increasing evidence that careful surveillance with ultrasensitive PSA may provide an opportunity for early salvage in patients who undergo observation, which can lead to excellent results in terms of disease control and survival.  His PSA was undetectable initially but has now increased up to 0.22.  Therefore, the recommendation is to proceed with salvage radiotherapy to the prostatic fossa and pelvic lymph nodes.  We discussed radiation treatment with regard to the logistics and delivery of external beam radiation treatment.  He  was encouraged to ask questions that were answered to his stated satisfaction.  At the conclusion of our conversation, the patient is interested in moving forward with 7.5 weeks of salvage external beam therapy without ADT since his PSA remains well below 0.5. We will share our discussion with his team at the [facility] and proceed with treatment planning  accordingly. The patient appears to have a good understanding of his disease and our treatment recommendations which are of curative intent and is in agreement with the stated plan.  He has freely signed written consent to proceed today in the office and a copy of this document will be placed in his medical record.  He is tentatively scheduled for CT simulation at 8 AM on 10/15/2024, in anticipation of beginning IMRT in the near future.  We enjoyed meeting him and his wife today and look forward to continuing to participate in his care.  We personally spent 60 minutes in this encounter including chart review, reviewing radiological studies, meeting face-to-face with the patient, entering orders and completing documentation.   Sabra MICAEL Rusk, PA-C    Donnice Barge, MD  [facility]  Radiation Oncology Direct Dial: [phone]  Fax: [phone] [email or web address]  Skype  [email or web address]   This document serves as a record of services personally performed by Donnice Barge, MD and Sabra Rusk, PA-C. It was created on their behalf by Izetta Neither, a trained medical scribe. The creation of this record is based on the scribe's personal observations and the provider's statements to them. This document has been checked and approved by the attending provider.

## 2024-10-05 ENCOUNTER — Ambulatory Visit
Admission: RE | Admit: 2024-10-05 | Discharge: 2024-10-05 | Disposition: A | Source: Ambulatory Visit | Attending: Radiation Oncology | Admitting: Radiation Oncology

## 2024-10-05 ENCOUNTER — Ambulatory Visit
Admission: RE | Admit: 2024-10-05 | Discharge: 2024-10-05 | Disposition: A | Source: Ambulatory Visit | Attending: Radiation Oncology

## 2024-10-05 ENCOUNTER — Encounter: Payer: Self-pay | Admitting: Radiation Oncology

## 2024-10-05 VITALS — BP 141/96 | HR 66 | Temp 97.3°F | Resp 18 | Ht 70.5 in | Wt 169.0 lb

## 2024-10-05 DIAGNOSIS — R9721 Rising PSA following treatment for malignant neoplasm of prostate: Secondary | ICD-10-CM

## 2024-10-05 NOTE — Progress Notes (Signed)
 Introduced myself to the patient, and his wife, as the prostate nurse navigator. He is here to discuss his radiation treatment options and he will proceed with 7.5 weeks of salvage radiation. I provided patient with some written education and my direct contact information.  Patient knows to reach out with any questions or barriers that may arise.

## 2024-10-06 ENCOUNTER — Telehealth: Payer: Self-pay | Admitting: Radiation Oncology

## 2024-10-06 DIAGNOSIS — Z9079 Acquired absence of other genital organ(s): Secondary | ICD-10-CM | POA: Insufficient documentation

## 2024-10-06 NOTE — Telephone Encounter (Signed)
 12/3 Received cd from [facility], unable to be attached at this time to PACs in epic, put in ticket with IT.  Waiting on images.

## 2024-10-07 ENCOUNTER — Telehealth: Payer: Self-pay | Admitting: Radiation Oncology

## 2024-10-07 NOTE — Telephone Encounter (Signed)
 12/4 Called canopy after IT created hyperlink for images in epic.  Working on attaching images to patient's timeline.

## 2024-10-15 ENCOUNTER — Ambulatory Visit
Admission: RE | Admit: 2024-10-15 | Discharge: 2024-10-15 | Disposition: A | Discharge status: Home / Self Care | Source: Ambulatory Visit | Attending: Radiation Oncology | Admitting: Radiation Oncology

## 2024-10-15 DIAGNOSIS — C61 Malignant neoplasm of prostate: Secondary | ICD-10-CM | POA: Diagnosis present

## 2024-10-15 DIAGNOSIS — Z510 Encounter for antineoplastic radiation therapy: Secondary | ICD-10-CM | POA: Diagnosis present

## 2024-10-16 NOTE — Progress Notes (Signed)
°  Radiation Oncology         (336) [phone] ________________________________  Name: Jared [name] MRN: [id number]  Date: 10/15/2024  DOB: 1966-07-20  SIMULATION AND TREATMENT PLANNING NOTE    ICD-10-CM   1. 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 gentleman with a detectable, rising PSA of 0.22 s/p RALP 04/2019 for Stage pT2cN0, Gleason 4+3 prostate cancer  NARRATIVE:  The patient was brought to the CT Simulation planning suite.  Identity was confirmed.  All relevant records and images related to the planned course of therapy were reviewed.  The patient freely provided informed written consent to proceed with treatment after reviewing the details related to the planned course of therapy. The consent form was witnessed and verified by the simulation staff.  Then, the patient was set-up in a stable reproducible supine position for radiation therapy.  A vacuum lock pillow device was custom fabricated to position his legs in a reproducible immobilized position.  Then, I performed a urethrogram under sterile conditions to identify the prostatic bed.  CT images were obtained.  Surface markings were placed.  The CT images were loaded into the planning software.  Then the prostate bed target, pelvic lymph node target and avoidance structures including the rectum, bladder, bowel and hips were contoured.  Treatment planning then occurred.  The radiation prescription was entered and confirmed.  A total of one complex treatment devices were fabricated. I have requested : Intensity Modulated Radiotherapy (IMRT) is medically necessary for this case for the following reason:  Rectal sparing.SABRA  PLAN:  The patient will receive 45 Gy in 25 fractions of 1.8 Gy, followed by a boost to the prostate bed to a total dose of 68.4 Gy with 13 additional fractions of 1.8 Gy.   ________________________________  Donnice FELIX  Patrcia, M.D.

## 2024-10-21 DIAGNOSIS — Z510 Encounter for antineoplastic radiation therapy: Secondary | ICD-10-CM | POA: Diagnosis not present

## 2024-11-01 ENCOUNTER — Ambulatory Visit
Admission: RE | Admit: 2024-11-01 | Discharge: 2024-11-01 | Disposition: A | Discharge status: Home / Self Care | Source: Ambulatory Visit | Attending: Radiation Oncology | Admitting: Radiation Oncology

## 2024-11-01 ENCOUNTER — Other Ambulatory Visit: Payer: Self-pay

## 2024-11-01 DIAGNOSIS — Z510 Encounter for antineoplastic radiation therapy: Secondary | ICD-10-CM | POA: Diagnosis not present

## 2024-11-01 LAB — RAD ONC ARIA SESSION SUMMARY
Course Elapsed Days: 0
Plan Fractions Treated to Date: 1
Plan Prescribed Dose Per Fraction: 1.8 Gy
Plan Total Fractions Prescribed: 25
Plan Total Prescribed Dose: 45 Gy
Reference Point Dosage Given to Date: 1.8 Gy
Reference Point Session Dosage Given: 1.8 Gy
Session Number: 1

## 2024-11-02 ENCOUNTER — Ambulatory Visit
Admission: RE | Admit: 2024-11-02 | Discharge: 2024-11-02 | Disposition: A | Discharge status: Home / Self Care | Source: Ambulatory Visit | Attending: Radiation Oncology | Admitting: Radiation Oncology

## 2024-11-02 ENCOUNTER — Other Ambulatory Visit: Payer: Self-pay

## 2024-11-02 DIAGNOSIS — Z510 Encounter for antineoplastic radiation therapy: Secondary | ICD-10-CM | POA: Diagnosis not present

## 2024-11-02 LAB — RAD ONC ARIA SESSION SUMMARY
Course Elapsed Days: 1
Plan Fractions Treated to Date: 2
Plan Prescribed Dose Per Fraction: 1.8 Gy
Plan Total Fractions Prescribed: 25
Plan Total Prescribed Dose: 45 Gy
Reference Point Dosage Given to Date: 3.6 Gy
Reference Point Session Dosage Given: 1.8 Gy
Session Number: 2

## 2024-11-03 ENCOUNTER — Other Ambulatory Visit: Payer: Self-pay

## 2024-11-03 ENCOUNTER — Ambulatory Visit
Admission: RE | Admit: 2024-11-03 | Discharge: 2024-11-03 | Disposition: A | Discharge status: Home / Self Care | Source: Ambulatory Visit | Attending: Radiation Oncology | Admitting: Radiation Oncology

## 2024-11-03 DIAGNOSIS — Z510 Encounter for antineoplastic radiation therapy: Secondary | ICD-10-CM | POA: Diagnosis not present

## 2024-11-03 LAB — RAD ONC ARIA SESSION SUMMARY
Course Elapsed Days: 2
Plan Fractions Treated to Date: 3
Plan Prescribed Dose Per Fraction: 1.8 Gy
Plan Total Fractions Prescribed: 25
Plan Total Prescribed Dose: 45 Gy
Reference Point Dosage Given to Date: 5.4 Gy
Reference Point Session Dosage Given: 1.8 Gy
Session Number: 3

## 2024-11-05 ENCOUNTER — Other Ambulatory Visit: Payer: Self-pay

## 2024-11-05 ENCOUNTER — Ambulatory Visit
Admission: RE | Admit: 2024-11-05 | Discharge: 2024-11-05 | Disposition: A | Discharge status: Home / Self Care | Source: Ambulatory Visit | Attending: Radiation Oncology | Admitting: Radiation Oncology

## 2024-11-05 DIAGNOSIS — C61 Malignant neoplasm of prostate: Secondary | ICD-10-CM | POA: Insufficient documentation

## 2024-11-05 DIAGNOSIS — Z510 Encounter for antineoplastic radiation therapy: Secondary | ICD-10-CM | POA: Insufficient documentation

## 2024-11-05 LAB — RAD ONC ARIA SESSION SUMMARY
Course Elapsed Days: 4
Plan Fractions Treated to Date: 4
Plan Prescribed Dose Per Fraction: 1.8 Gy
Plan Total Fractions Prescribed: 25
Plan Total Prescribed Dose: 45 Gy
Reference Point Dosage Given to Date: 7.2 Gy
Reference Point Session Dosage Given: 1.8 Gy
Session Number: 4

## 2024-11-08 ENCOUNTER — Ambulatory Visit
Admission: RE | Admit: 2024-11-08 | Discharge: 2024-11-08 | Disposition: A | Discharge status: Home / Self Care | Source: Ambulatory Visit | Attending: Radiation Oncology | Admitting: Radiation Oncology

## 2024-11-08 ENCOUNTER — Other Ambulatory Visit: Payer: Self-pay

## 2024-11-08 DIAGNOSIS — Z510 Encounter for antineoplastic radiation therapy: Secondary | ICD-10-CM | POA: Diagnosis not present

## 2024-11-08 LAB — RAD ONC ARIA SESSION SUMMARY
Course Elapsed Days: 7
Plan Fractions Treated to Date: 5
Plan Prescribed Dose Per Fraction: 1.8 Gy
Plan Total Fractions Prescribed: 25
Plan Total Prescribed Dose: 45 Gy
Reference Point Dosage Given to Date: 9 Gy
Reference Point Session Dosage Given: 1.8 Gy
Session Number: 5

## 2024-11-09 ENCOUNTER — Ambulatory Visit
Admission: RE | Admit: 2024-11-09 | Discharge: 2024-11-09 | Disposition: A | Discharge status: Home / Self Care | Source: Ambulatory Visit | Attending: Radiation Oncology | Admitting: Radiation Oncology

## 2024-11-09 ENCOUNTER — Other Ambulatory Visit: Payer: Self-pay

## 2024-11-09 DIAGNOSIS — Z510 Encounter for antineoplastic radiation therapy: Secondary | ICD-10-CM | POA: Diagnosis not present

## 2024-11-09 LAB — RAD ONC ARIA SESSION SUMMARY
Course Elapsed Days: 8
Plan Fractions Treated to Date: 6
Plan Prescribed Dose Per Fraction: 1.8 Gy
Plan Total Fractions Prescribed: 25
Plan Total Prescribed Dose: 45 Gy
Reference Point Dosage Given to Date: 10.8 Gy
Reference Point Session Dosage Given: 1.8 Gy
Session Number: 6

## 2024-11-10 ENCOUNTER — Ambulatory Visit
Admission: RE | Admit: 2024-11-10 | Discharge: 2024-11-10 | Disposition: A | Discharge status: Home / Self Care | Source: Ambulatory Visit | Attending: Radiation Oncology | Admitting: Radiation Oncology

## 2024-11-10 ENCOUNTER — Other Ambulatory Visit: Payer: Self-pay

## 2024-11-10 DIAGNOSIS — Z510 Encounter for antineoplastic radiation therapy: Secondary | ICD-10-CM | POA: Diagnosis not present

## 2024-11-10 LAB — RAD ONC ARIA SESSION SUMMARY
Course Elapsed Days: 9
Plan Fractions Treated to Date: 7
Plan Prescribed Dose Per Fraction: 1.8 Gy
Plan Total Fractions Prescribed: 25
Plan Total Prescribed Dose: 45 Gy
Reference Point Dosage Given to Date: 12.6 Gy
Reference Point Session Dosage Given: 1.8 Gy
Session Number: 7

## 2024-11-11 ENCOUNTER — Ambulatory Visit
Admission: RE | Admit: 2024-11-11 | Discharge: 2024-11-11 | Disposition: A | Discharge status: Home / Self Care | Source: Ambulatory Visit | Attending: Radiation Oncology | Admitting: Radiation Oncology

## 2024-11-11 ENCOUNTER — Other Ambulatory Visit: Payer: Self-pay

## 2024-11-11 DIAGNOSIS — Z510 Encounter for antineoplastic radiation therapy: Secondary | ICD-10-CM | POA: Diagnosis not present

## 2024-11-11 LAB — RAD ONC ARIA SESSION SUMMARY
Course Elapsed Days: 10
Plan Fractions Treated to Date: 8
Plan Prescribed Dose Per Fraction: 1.8 Gy
Plan Total Fractions Prescribed: 25
Plan Total Prescribed Dose: 45 Gy
Reference Point Dosage Given to Date: 14.4 Gy
Reference Point Session Dosage Given: 1.8 Gy
Session Number: 8

## 2024-11-12 ENCOUNTER — Ambulatory Visit

## 2024-11-12 ENCOUNTER — Other Ambulatory Visit: Payer: Self-pay

## 2024-11-12 ENCOUNTER — Ambulatory Visit
Admission: RE | Admit: 2024-11-12 | Discharge: 2024-11-12 | Disposition: A | Discharge status: Home / Self Care | Source: Ambulatory Visit | Attending: Radiation Oncology | Admitting: Radiation Oncology

## 2024-11-12 DIAGNOSIS — Z510 Encounter for antineoplastic radiation therapy: Secondary | ICD-10-CM | POA: Diagnosis not present

## 2024-11-12 LAB — RAD ONC ARIA SESSION SUMMARY
Course Elapsed Days: 11
Plan Fractions Treated to Date: 9
Plan Prescribed Dose Per Fraction: 1.8 Gy
Plan Total Fractions Prescribed: 25
Plan Total Prescribed Dose: 45 Gy
Reference Point Dosage Given to Date: 16.2 Gy
Reference Point Session Dosage Given: 1.8 Gy
Session Number: 9

## 2024-11-15 ENCOUNTER — Other Ambulatory Visit: Payer: Self-pay

## 2024-11-15 ENCOUNTER — Ambulatory Visit
Admission: RE | Admit: 2024-11-15 | Discharge: 2024-11-15 | Disposition: A | Discharge status: Home / Self Care | Source: Ambulatory Visit | Attending: Radiation Oncology | Admitting: Radiation Oncology

## 2024-11-15 DIAGNOSIS — Z510 Encounter for antineoplastic radiation therapy: Secondary | ICD-10-CM | POA: Diagnosis not present

## 2024-11-15 LAB — RAD ONC ARIA SESSION SUMMARY
Course Elapsed Days: 14
Plan Fractions Treated to Date: 10
Plan Prescribed Dose Per Fraction: 1.8 Gy
Plan Total Fractions Prescribed: 25
Plan Total Prescribed Dose: 45 Gy
Reference Point Dosage Given to Date: 18 Gy
Reference Point Session Dosage Given: 1.8 Gy
Session Number: 10

## 2024-11-16 ENCOUNTER — Ambulatory Visit
Admission: RE | Admit: 2024-11-16 | Discharge: 2024-11-16 | Disposition: A | Source: Ambulatory Visit | Attending: Radiation Oncology

## 2024-11-16 ENCOUNTER — Other Ambulatory Visit: Payer: Self-pay

## 2024-11-16 DIAGNOSIS — Z510 Encounter for antineoplastic radiation therapy: Secondary | ICD-10-CM | POA: Diagnosis not present

## 2024-11-16 LAB — RAD ONC ARIA SESSION SUMMARY
Course Elapsed Days: 15
Plan Fractions Treated to Date: 11
Plan Prescribed Dose Per Fraction: 1.8 Gy
Plan Total Fractions Prescribed: 25
Plan Total Prescribed Dose: 45 Gy
Reference Point Dosage Given to Date: 19.8 Gy
Reference Point Session Dosage Given: 1.8 Gy
Session Number: 11

## 2024-11-17 ENCOUNTER — Ambulatory Visit
Admission: RE | Admit: 2024-11-17 | Discharge: 2024-11-17 | Disposition: A | Discharge status: Home / Self Care | Source: Ambulatory Visit | Attending: Radiation Oncology | Admitting: Radiation Oncology

## 2024-11-17 ENCOUNTER — Other Ambulatory Visit: Payer: Self-pay

## 2024-11-17 DIAGNOSIS — Z510 Encounter for antineoplastic radiation therapy: Secondary | ICD-10-CM | POA: Diagnosis not present

## 2024-11-17 LAB — RAD ONC ARIA SESSION SUMMARY
Course Elapsed Days: 16
Plan Fractions Treated to Date: 12
Plan Prescribed Dose Per Fraction: 1.8 Gy
Plan Total Fractions Prescribed: 25
Plan Total Prescribed Dose: 45 Gy
Reference Point Dosage Given to Date: 21.6 Gy
Reference Point Session Dosage Given: 1.8 Gy
Session Number: 12

## 2024-11-18 ENCOUNTER — Ambulatory Visit
Admission: RE | Admit: 2024-11-18 | Discharge: 2024-11-18 | Disposition: A | Source: Ambulatory Visit | Attending: Radiation Oncology

## 2024-11-18 ENCOUNTER — Other Ambulatory Visit: Payer: Self-pay

## 2024-11-18 DIAGNOSIS — Z510 Encounter for antineoplastic radiation therapy: Secondary | ICD-10-CM | POA: Diagnosis not present

## 2024-11-18 LAB — RAD ONC ARIA SESSION SUMMARY
Course Elapsed Days: 17
Plan Fractions Treated to Date: 13
Plan Prescribed Dose Per Fraction: 1.8 Gy
Plan Total Fractions Prescribed: 25
Plan Total Prescribed Dose: 45 Gy
Reference Point Dosage Given to Date: 23.4 Gy
Reference Point Session Dosage Given: 1.8 Gy
Session Number: 13

## 2024-11-19 ENCOUNTER — Ambulatory Visit
Admission: RE | Admit: 2024-11-19 | Discharge: 2024-11-19 | Disposition: A | Discharge status: Home / Self Care | Source: Ambulatory Visit | Attending: Radiation Oncology | Admitting: Radiation Oncology

## 2024-11-19 ENCOUNTER — Other Ambulatory Visit: Payer: Self-pay

## 2024-11-19 DIAGNOSIS — Z510 Encounter for antineoplastic radiation therapy: Secondary | ICD-10-CM | POA: Diagnosis not present

## 2024-11-19 LAB — RAD ONC ARIA SESSION SUMMARY
Course Elapsed Days: 18
Plan Fractions Treated to Date: 14
Plan Prescribed Dose Per Fraction: 1.8 Gy
Plan Total Fractions Prescribed: 25
Plan Total Prescribed Dose: 45 Gy
Reference Point Dosage Given to Date: 25.2 Gy
Reference Point Session Dosage Given: 1.8 Gy
Session Number: 14

## 2024-11-22 ENCOUNTER — Other Ambulatory Visit: Payer: Self-pay

## 2024-11-22 ENCOUNTER — Ambulatory Visit
Admission: RE | Admit: 2024-11-22 | Discharge: 2024-11-22 | Disposition: A | Source: Ambulatory Visit | Attending: Radiation Oncology

## 2024-11-22 DIAGNOSIS — Z510 Encounter for antineoplastic radiation therapy: Secondary | ICD-10-CM | POA: Diagnosis not present

## 2024-11-22 LAB — RAD ONC ARIA SESSION SUMMARY
Course Elapsed Days: 21
Plan Fractions Treated to Date: 15
Plan Prescribed Dose Per Fraction: 1.8 Gy
Plan Total Fractions Prescribed: 25
Plan Total Prescribed Dose: 45 Gy
Reference Point Dosage Given to Date: 27 Gy
Reference Point Session Dosage Given: 1.8 Gy
Session Number: 15

## 2024-11-23 ENCOUNTER — Ambulatory Visit
Admission: RE | Admit: 2024-11-23 | Discharge: 2024-11-23 | Disposition: A | Discharge status: Home / Self Care | Source: Ambulatory Visit | Attending: Radiation Oncology | Admitting: Radiation Oncology

## 2024-11-23 ENCOUNTER — Other Ambulatory Visit: Payer: Self-pay

## 2024-11-23 DIAGNOSIS — Z510 Encounter for antineoplastic radiation therapy: Secondary | ICD-10-CM | POA: Diagnosis not present

## 2024-11-23 LAB — RAD ONC ARIA SESSION SUMMARY
Course Elapsed Days: 22
Plan Fractions Treated to Date: 16
Plan Prescribed Dose Per Fraction: 1.8 Gy
Plan Total Fractions Prescribed: 25
Plan Total Prescribed Dose: 45 Gy
Reference Point Dosage Given to Date: 28.8 Gy
Reference Point Session Dosage Given: 1.8 Gy
Session Number: 16

## 2024-11-24 ENCOUNTER — Ambulatory Visit
Admission: RE | Admit: 2024-11-24 | Discharge: 2024-11-24 | Disposition: A | Source: Ambulatory Visit | Attending: Radiation Oncology

## 2024-11-24 ENCOUNTER — Other Ambulatory Visit: Payer: Self-pay

## 2024-11-24 DIAGNOSIS — Z510 Encounter for antineoplastic radiation therapy: Secondary | ICD-10-CM | POA: Diagnosis not present

## 2024-11-24 LAB — RAD ONC ARIA SESSION SUMMARY
Course Elapsed Days: 23
Plan Fractions Treated to Date: 17
Plan Prescribed Dose Per Fraction: 1.8 Gy
Plan Total Fractions Prescribed: 25
Plan Total Prescribed Dose: 45 Gy
Reference Point Dosage Given to Date: 30.6 Gy
Reference Point Session Dosage Given: 1.8 Gy
Session Number: 17

## 2024-11-25 ENCOUNTER — Ambulatory Visit
Admission: RE | Admit: 2024-11-25 | Discharge: 2024-11-25 | Disposition: A | Discharge status: Home / Self Care | Source: Ambulatory Visit | Attending: Radiation Oncology | Admitting: Radiation Oncology

## 2024-11-25 ENCOUNTER — Other Ambulatory Visit: Payer: Self-pay

## 2024-11-25 DIAGNOSIS — Z510 Encounter for antineoplastic radiation therapy: Secondary | ICD-10-CM | POA: Diagnosis not present

## 2024-11-25 LAB — RAD ONC ARIA SESSION SUMMARY
Course Elapsed Days: 24
Plan Fractions Treated to Date: 18
Plan Prescribed Dose Per Fraction: 1.8 Gy
Plan Total Fractions Prescribed: 25
Plan Total Prescribed Dose: 45 Gy
Reference Point Dosage Given to Date: 32.4 Gy
Reference Point Session Dosage Given: 1.8 Gy
Session Number: 18

## 2024-11-26 ENCOUNTER — Ambulatory Visit
Admission: RE | Admit: 2024-11-26 | Discharge: 2024-11-26 | Disposition: A | Source: Ambulatory Visit | Attending: Radiation Oncology

## 2024-11-26 ENCOUNTER — Other Ambulatory Visit: Payer: Self-pay

## 2024-11-26 ENCOUNTER — Ambulatory Visit
Admission: RE | Admit: 2024-11-26 | Discharge: 2024-11-26 | Disposition: A | Discharge status: Home / Self Care | Source: Ambulatory Visit | Attending: Radiation Oncology | Admitting: Radiation Oncology

## 2024-11-26 DIAGNOSIS — Z510 Encounter for antineoplastic radiation therapy: Secondary | ICD-10-CM | POA: Diagnosis not present

## 2024-11-26 LAB — RAD ONC ARIA SESSION SUMMARY
Course Elapsed Days: 25
Plan Fractions Treated to Date: 19
Plan Prescribed Dose Per Fraction: 1.8 Gy
Plan Total Fractions Prescribed: 25
Plan Total Prescribed Dose: 45 Gy
Reference Point Dosage Given to Date: 34.2 Gy
Reference Point Session Dosage Given: 1.8 Gy
Session Number: 19

## 2024-11-29 ENCOUNTER — Ambulatory Visit

## 2024-11-30 ENCOUNTER — Ambulatory Visit

## 2024-12-01 ENCOUNTER — Ambulatory Visit
Admission: RE | Admit: 2024-12-01 | Discharge: 2024-12-01 | Disposition: A | Source: Ambulatory Visit | Attending: Radiation Oncology

## 2024-12-01 ENCOUNTER — Other Ambulatory Visit: Payer: Self-pay

## 2024-12-01 LAB — RAD ONC ARIA SESSION SUMMARY
Course Elapsed Days: 30
Plan Fractions Treated to Date: 20
Plan Prescribed Dose Per Fraction: 1.8 Gy
Plan Total Fractions Prescribed: 25
Plan Total Prescribed Dose: 45 Gy
Reference Point Dosage Given to Date: 36 Gy
Reference Point Session Dosage Given: 1.8 Gy
Session Number: 20

## 2024-12-02 ENCOUNTER — Other Ambulatory Visit: Payer: Self-pay

## 2024-12-02 ENCOUNTER — Ambulatory Visit
Admission: RE | Admit: 2024-12-02 | Discharge: 2024-12-02 | Disposition: A | Discharge status: Home / Self Care | Source: Ambulatory Visit | Attending: Radiation Oncology | Admitting: Radiation Oncology

## 2024-12-02 LAB — RAD ONC ARIA SESSION SUMMARY
Course Elapsed Days: 31
Plan Fractions Treated to Date: 21
Plan Prescribed Dose Per Fraction: 1.8 Gy
Plan Total Fractions Prescribed: 25
Plan Total Prescribed Dose: 45 Gy
Reference Point Dosage Given to Date: 37.8 Gy
Reference Point Session Dosage Given: 1.8 Gy
Session Number: 21

## 2024-12-03 ENCOUNTER — Ambulatory Visit
Admission: RE | Admit: 2024-12-03 | Discharge: 2024-12-03 | Disposition: A | Source: Ambulatory Visit | Attending: Radiation Oncology

## 2024-12-03 ENCOUNTER — Other Ambulatory Visit: Payer: Self-pay

## 2024-12-03 LAB — RAD ONC ARIA SESSION SUMMARY
Course Elapsed Days: 32
Plan Fractions Treated to Date: 22
Plan Prescribed Dose Per Fraction: 1.8 Gy
Plan Total Fractions Prescribed: 25
Plan Total Prescribed Dose: 45 Gy
Reference Point Dosage Given to Date: 39.6 Gy
Reference Point Session Dosage Given: 1.8 Gy
Session Number: 22

## 2024-12-06 ENCOUNTER — Ambulatory Visit

## 2024-12-07 ENCOUNTER — Ambulatory Visit

## 2024-12-07 ENCOUNTER — Other Ambulatory Visit: Payer: Self-pay

## 2024-12-07 ENCOUNTER — Ambulatory Visit
Admission: RE | Admit: 2024-12-07 | Discharge: 2024-12-07 | Disposition: A | Source: Ambulatory Visit | Attending: Radiation Oncology

## 2024-12-07 LAB — RAD ONC ARIA SESSION SUMMARY
Course Elapsed Days: 36
Plan Fractions Treated to Date: 23
Plan Prescribed Dose Per Fraction: 1.8 Gy
Plan Total Fractions Prescribed: 25
Plan Total Prescribed Dose: 45 Gy
Reference Point Dosage Given to Date: 41.4 Gy
Reference Point Session Dosage Given: 1.8 Gy
Session Number: 23

## 2024-12-08 ENCOUNTER — Ambulatory Visit
Admission: RE | Admit: 2024-12-08 | Discharge: 2024-12-08 | Disposition: A | Source: Ambulatory Visit | Attending: Radiation Oncology

## 2024-12-08 ENCOUNTER — Other Ambulatory Visit: Payer: Self-pay

## 2024-12-08 ENCOUNTER — Ambulatory Visit

## 2024-12-08 LAB — RAD ONC ARIA SESSION SUMMARY
Course Elapsed Days: 37
Plan Fractions Treated to Date: 24
Plan Prescribed Dose Per Fraction: 1.8 Gy
Plan Total Fractions Prescribed: 25
Plan Total Prescribed Dose: 45 Gy
Reference Point Dosage Given to Date: 43.2 Gy
Reference Point Session Dosage Given: 1.8 Gy
Session Number: 24

## 2024-12-09 ENCOUNTER — Ambulatory Visit

## 2024-12-09 ENCOUNTER — Other Ambulatory Visit: Payer: Self-pay

## 2024-12-09 LAB — RAD ONC ARIA SESSION SUMMARY
Course Elapsed Days: 38
Plan Fractions Treated to Date: 25
Plan Prescribed Dose Per Fraction: 1.8 Gy
Plan Total Fractions Prescribed: 25
Plan Total Prescribed Dose: 45 Gy
Reference Point Dosage Given to Date: 45 Gy
Reference Point Session Dosage Given: 1.8 Gy
Session Number: 25

## 2024-12-10 ENCOUNTER — Other Ambulatory Visit: Payer: Self-pay

## 2024-12-10 ENCOUNTER — Ambulatory Visit: Admission: RE | Admit: 2024-12-10

## 2024-12-10 ENCOUNTER — Ambulatory Visit

## 2024-12-10 ENCOUNTER — Ambulatory Visit: Admission: RE | Admit: 2024-12-10 | Source: Ambulatory Visit

## 2024-12-10 LAB — RAD ONC ARIA SESSION SUMMARY
Course Elapsed Days: 39
Plan Fractions Treated to Date: 1
Plan Prescribed Dose Per Fraction: 1.8 Gy
Plan Total Fractions Prescribed: 13
Plan Total Prescribed Dose: 23.4 Gy
Reference Point Dosage Given to Date: 1.8 Gy
Reference Point Session Dosage Given: 1.8 Gy
Session Number: 26

## 2024-12-13 ENCOUNTER — Ambulatory Visit

## 2024-12-14 ENCOUNTER — Ambulatory Visit

## 2024-12-15 ENCOUNTER — Ambulatory Visit

## 2024-12-16 ENCOUNTER — Ambulatory Visit

## 2024-12-17 ENCOUNTER — Ambulatory Visit

## 2024-12-20 ENCOUNTER — Ambulatory Visit

## 2024-12-21 ENCOUNTER — Ambulatory Visit

## 2024-12-22 ENCOUNTER — Ambulatory Visit

## 2024-12-23 ENCOUNTER — Ambulatory Visit

## 2024-12-24 ENCOUNTER — Ambulatory Visit

## 2024-12-27 ENCOUNTER — Ambulatory Visit

## 2024-12-28 ENCOUNTER — Ambulatory Visit
